# Patient Record
Sex: Female | Born: 1985 | Race: White | Hispanic: No | Marital: Married | Smoking: Never smoker
Health system: Southern US, Community
[De-identification: ages and names within clinical notes are randomized; demographics above are authoritative.]

## PROBLEM LIST (undated history)

## (undated) DIAGNOSIS — G43909 Migraine, unspecified, not intractable, without status migrainosus: Secondary | ICD-10-CM

## (undated) DIAGNOSIS — T7840XA Allergy, unspecified, initial encounter: Secondary | ICD-10-CM

## (undated) DIAGNOSIS — M1990 Unspecified osteoarthritis, unspecified site: Secondary | ICD-10-CM

## (undated) DIAGNOSIS — J45909 Unspecified asthma, uncomplicated: Secondary | ICD-10-CM

## (undated) HISTORY — PX: HAND SURGERY: SHX662

## (undated) HISTORY — PX: KNEE SURGERY: SHX244

## (undated) HISTORY — DX: Allergy, unspecified, initial encounter: T78.40XA

## (undated) HISTORY — PX: MIDDLE EAR SURGERY: SHX713

## (undated) HISTORY — DX: Unspecified osteoarthritis, unspecified site: M19.90

## (undated) HISTORY — DX: Unspecified asthma, uncomplicated: J45.909

---
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
## 2009-07-29 ENCOUNTER — Emergency Department (HOSPITAL_COMMUNITY): Admission: EM | Admit: 2009-07-29 | Discharge: 2009-07-30 | Payer: Self-pay | Admitting: Emergency Medicine

## 2010-05-02 ENCOUNTER — Other Ambulatory Visit: Payer: Self-pay | Admitting: Otolaryngology

## 2010-05-02 DIAGNOSIS — J329 Chronic sinusitis, unspecified: Secondary | ICD-10-CM

## 2010-05-10 ENCOUNTER — Ambulatory Visit
Admission: RE | Admit: 2010-05-10 | Discharge: 2010-05-10 | Disposition: A | Discharge status: Home / Self Care | Payer: 59 | Source: Ambulatory Visit | Attending: Otolaryngology | Admitting: Otolaryngology

## 2010-05-10 DIAGNOSIS — J329 Chronic sinusitis, unspecified: Secondary | ICD-10-CM

## 2010-05-26 ENCOUNTER — Ambulatory Visit (HOSPITAL_BASED_OUTPATIENT_CLINIC_OR_DEPARTMENT_OTHER)
Admission: RE | Admit: 2010-05-26 | Discharge: 2010-05-26 | Disposition: A | Discharge status: Home / Self Care | Payer: 59 | Source: Ambulatory Visit | Attending: Otolaryngology | Admitting: Otolaryngology

## 2010-05-26 DIAGNOSIS — J45909 Unspecified asthma, uncomplicated: Secondary | ICD-10-CM | POA: Insufficient documentation

## 2010-05-26 DIAGNOSIS — H7290 Unspecified perforation of tympanic membrane, unspecified ear: Secondary | ICD-10-CM | POA: Insufficient documentation

## 2010-05-26 DIAGNOSIS — Z01812 Encounter for preprocedural laboratory examination: Secondary | ICD-10-CM | POA: Insufficient documentation

## 2010-06-16 NOTE — Op Note (Signed)
NAMEAYZIA, DAY             ACCOUNT NO.:  [id number]  MEDICAL RECORD NO.:  [id number]           PATIENT TYPE:  LOCATION:                                 FACILITY:  PHYSICIAN:  Kristine Garbe. Ezzard Standing, M.D. 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  OPERATION PERFORMED:  Left medial graft tympanoplasty.  SURGEON:  Kristine Garbe. Ezzard Standing, MD  ANESTHESIA:  General endotracheal.  COMPLICATIONS:  None.  BRIEF CLINICAL NOTE:  Kristin Rice is a 25 year old female who has had previous history of ear problems and mastoid disease who had previous myringotomy tubes placed elsewhere.  Since her tubes have extruded, she has had a persistent left TM perforation.  On examination, she has approximately 30% to 40% large inferior TM perforation which is dry. 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  She is taken to the operating room at this time for left tympanoplasty.  DESCRIPTION OF PROCEDURE:  After adequate endotracheal anesthesia, the patient received 1 g Ancef IV preoperatively.  Left ear was prepped with Betadine solution and Betadine sterile towels.  Ear canal was then irrigated with saline.  Perforation was observed and was moderately large inferior-posterior TM perforation.  This was central.  The rim of the perforation was freshened up with a cup forceps and pick.  The perforation extended to the umbo after freshening up the edges of the perforation.  Ear canal was injected with Xylocaine with epinephrine  and a posterior-based tympanomeatal flap was elevated down to the annulus. Cotton pledgets soaked in adrenaline was placed for hemostasis, and prior to elevating the annulus a small postauricular incision was made just posterior and superior to the ear and a temporalis fascia graft was harvested and set aside to dry.  The graft site was closed with 3-0 chromic sutures subcutaneously and 5-0 plain gut to reapproximate skin edges.  The annulus was then elevated.  The middle ear space was entered.  There were few adhesions around the incus and stapes which were lysed.  There was no evidence of disease process, acute infection, or cholesteatoma.  Ear canal was irrigated with saline.  Few adhesions were lysed with a pick.  The previously harvested fascial graft was cut to appropriate size and was placed medial to the tympanomeatal flap covering the entire perforation.  The middle ear space was packed with Gelfoam soaked in Ciprodex.  The tympanomeatal flap and graft were brought back down over the middle ear packing.  The fascia graft covered the entire perforation well.  Ear canal was then packed with Gelfoam soaked in Ciprodex.  Cotton ball was placed in the ear canal after packing the ear canal with Gelfoam and bacitracin ointment was applied to the  small incision behind the ear and Glasscock dressing was placed over the left ear.  This completed the procedure.  Keondra was awoke from anesthesia and transferred to recovery room postop doing well.  DISPOSITION:  She was discharged home later this morning on Tylenol, Motrin, or Vicodin p.r.n. pain.  She is presently taking a Z-Pak and will complete the remaining Z-Pak.  We will have her follow up in my office in 1 week for recheck.  She was instructed to leave the Glasscock dressing on until tomorrow and can remove this and change the cotton ball in the ear and keep the ear dry.          ______________________________ Kristine Garbe.  Ezzard Standing, M.D.     CEN/MEDQ  D:  05/26/2010  T:  05/26/2010  Job:  [id number]  Electronically Signed by Dillard Cannon M.D. on 06/16/2010 11:10:00 AM

## 2012-08-25 ENCOUNTER — Ambulatory Visit: Payer: PRIVATE HEALTH INSURANCE

## 2012-08-25 ENCOUNTER — Ambulatory Visit (INDEPENDENT_AMBULATORY_CARE_PROVIDER_SITE_OTHER): Payer: PRIVATE HEALTH INSURANCE | Admitting: Family Medicine

## 2012-08-25 VITALS — BP 132/82 | HR 88 | Temp 98.3°F | Resp 16 | Ht 63.0 in | Wt 152.0 lb

## 2012-08-25 DIAGNOSIS — M25569 Pain in unspecified knee: Secondary | ICD-10-CM

## 2012-08-25 DIAGNOSIS — M25561 Pain in right knee: Secondary | ICD-10-CM

## 2012-08-25 MED ORDER — HYDROCODONE-ACETAMINOPHEN 5-325 MG PO TABS: 1.0000 | ORAL_TABLET | Freq: Four times a day (QID) | ORAL | Status: AC | PRN

## 2012-08-25 NOTE — Patient Instructions (Addendum)
Medial Collateral Knee Ligament Sprain  with Phase I Rehab The medial collateral ligament (MCL) of the knee helps hold the knee joint in proper alignment and prevents the bones from shifting out of alignment (displacing) to the inside (medially). Injury to the knee may cause a tear in the MCL ligament (sprain). Sprains may heal without treatment, but this often results in a loose joint. Sprains are classified into three categories. Grade 1 sprains cause pain, but the tendon is not lengthened. Grade 2 sprains include a lengthened ligament, due to the ligament being stretched or partially ruptured. With grade 2 sprains, there is still function, although possibly decreased. Grade 3 sprains involve a complete tear of the tendon or muscle, and function is usually impaired. SYMPTOMS   Pain and tenderness on the inner side of the knee.  A "pop," tearing or pulling sensation at the time of injury.  Bruising (contusion) at the site of injury, within 48 hours of injury.  Knee stiffness.  Limping, often walking with the knee bent. CAUSES  An MCL sprain occurs when a force is placed on the ligament that is greater than it can handle. Common mechanisms of injury include:  Direct hit (trauma) to the outer side of the knee, especially if the foot is planted on the ground.  Forceful pivoting of the body and leg, while the foot is planted on the ground. RISK INCREASES WITH:  Contact sports (football, rugby).  Sports that require pivoting or cutting (soccer).  Poor knee strength and flexibility.  Improper equipment use. PREVENTION  Warm up and stretch properly before activity.  Maintain physical fitness:  Strength, flexibility and endurance.  Cardiovascular fitness.  Wear properly fitted protective equipment (correct length of cleats for surface).  Functional braces may be effective in preventing injury. PROGNOSIS  MCL tears usually heal without the need for surgery. Sometimes however,  surgery is required. RELATED COMPLICATIONS  Frequently recurring symptoms, such as the knee giving way, knee instability or knee swelling.  Injury to other structures in the knee joint:  Meniscal cartilage, resulting in locking and swelling of the knee.  Articular cartilage, resulting in knee arthritis.  Other ligaments of the knee.  Injury to nerves, resulting in numbness of the outer leg, foot or ankle and weakness or paralysis, with inability to raise the ankle or toes.  Knee stiffness. TREATMENT Treatment first involves the use of ice and medicine, to reduce pain and inflammation. The use of strengthening and stretching exercises may help reduce pain with activity. These exercises may be performed at home, but referral to a therapist is often advised. You may be advised to walk with crutches until you are able to walk without a limp. Your caregiver may provide you with a hinged knee brace to help regain a full range of motion, while also protecting the injured knee. For severe MCL injuries or injuries that involve other ligaments of the knee, surgery is often advised. MEDICATION  Do not take pain medicine for 7 days before surgery.  Only use over-the-counter pain medicine as directed by your caregiver.  Only use prescription pain relievers as directed and only in needed amounts. HEAT AND COLD  Cold treatment (icing) should be applied for 10 to 15 minutes every 2 to 3 hours for inflammation and pain, and immediately after any activity, that aggravates the symptoms. Use ice packs or an ice massage.  Heat treatment may be used before performing stretching and strengthening activities prescribed by your caregiver, physical therapist or athletic trainer.   Use a heat pack or warm water soak. SEEK MEDICAL CARE IF:   Symptoms get worse or do not improve in 4 to 6 weeks, despite treatment.  New, unexplained symptoms develop. EXERCISES  PHASE I EXERCISES  RANGE OF MOTION (ROM) AND  STRETCHING EXERCISES Medial Collateral Knee Ligament Sprain Phase I These are some of the initial exercises that your physician, physical therapist or athletic trainer may have you perform to begin your rehabilitation. When you demonstrate gains in your flexibility and strength, your caregiver may progress you to Phase II exercises. As you perform these exercises, remember:  These initial exercises are intended to be gentle. They will help you restore motion without increasing any swelling.  Completing these exercises allows less painful movement and prepares you for the more aggressive strengthening exercises in Phase II.  An effective stretch should be held for at least 30 seconds.  A stretch should never be painful. You should only feel a gentle lengthening or release in the stretched tissue. RANGE OF MOTION Knee Flexion, Active  Lie on your back with both knees straight. (If this causes back discomfort, bend your healthy knee, placing your foot flat on the floor.)  Slowly slide your heel back toward your buttocks until you feel a gentle stretch in the front of your knee or thigh.  Hold for __________ seconds. Slowly slide your heel back to the starting position. Repeat __________ times. Complete this exercise __________ times per day. STRETCH Knee Flexion, Supine  Lie on the floor with your right / left heel and foot lightly touching the wall. (Place both feet on the wall if you do not use a door frame.)  Without using any effort, allow gravity to slide your foot down the wall slowly until you feel a gentle stretch in the front of your right / left knee.  Hold this stretch for __________ seconds. Then return the leg to the starting position, using your health leg for help, if needed. Repeat __________ times. Complete this stretch __________ times per day. RANGE OF MOTION Knee Flexion and Extension, Active-Assisted  Sit on the edge of a table or chair with your thighs firmly supported.  It may be helpful to place a folded towel under the end of your right / left thigh.  Flexion (bending): Place the ankle of your healthy leg on top of the other ankle. Use your healthy leg to gently bend your right / left knee until you feel a mild tension across the top of your knee.  Hold for __________ seconds.  Extension (straightening): Switch your ankles so your right / left leg is on top. Use your healthy leg to straighten your right / left knee until you feel a mild tension on the backside of your knee.  Hold for __________ seconds. Repeat __________ times. Complete this exercise __________ times per day. STRETCH Knee Extension Sitting  Sit with your right / left leg/heel propped on another chair, coffee table, or foot stool.  Allow your leg muscles to relax, letting gravity straighten out your knee.*  You should feel a stretch behind your right / left knee. Hold this position for __________ seconds. Repeat __________ times. Complete this stretch __________ times per day. *Your physician, physical therapist or athletic trainer may instruct you to place a __________ weight on your thigh, just above your kneecap, to deepen the stretch. STRENGTHENING EXERCISES Medial Collateral Knee Ligament Sprain Phase I These exercises may help you when beginning to rehabilitate your injury. They may resolve your   symptoms with or without further involvement from your physician, physical therapist or athletic trainer. While completing these exercises, remember:   In order to return to more demanding activities, you will likely need to progress to more challenging exercises. Your physician, physical therapist or athletic trainer will advance your exercises when your tissues show adequate healing and your muscles demonstrate increased strength.  Muscles can gain both the endurance and the strength needed for everyday activities through controlled exercises.  Complete these exercises as instructed by  your physician, physical therapist or athletic trainer. Increase the resistance and repetitions only as guided by your caregiver. STRENGTH Quadriceps, Isometrics  Lie on your back with your right / left leg extended and your opposite knee bent.  Gradually tense the muscles in the front of your right / left thigh. You should see either your kneecap slide up toward your hip or an increased dimpling just above the knee. This motion will push the back of the knee down toward the floor, mat or bed on which you are lying.  Hold the muscle as tight as you can without increasing your pain for __________ seconds.  Relax the muscles slowly and completely in between each repetition. Repeat __________ times. Complete this exercise __________ times per day. STRENGTH Quadriceps, Short Arcs  Lie on your back. Place a __________ inch towel roll under your knee so that the knee slightly bends.  Raise only your lower leg by tightening the muscles in the front of your thigh. Do not allow your thigh to rise.  Hold this position for __________ seconds. Repeat __________ times. Complete this exercise __________ times per day. OPTIONAL ANKLE WEIGHTS: Begin with ____________________, but DO NOT exceed ____________________. Increase in 1 pound/0.5 kilogram increments.  STRENGTH- Quadriceps, Straight Leg Raises Quality counts! Watch for signs that the quadriceps muscle is working, to be sure you are strengthening the correct muscles and not "cheating" by substituting with healthier muscles.  Lay on your back with your right / left leg extended and your opposite knee bent.  Tense the muscles in the front of your right / left thigh. You should see either your knee cap slide up or increased dimpling just above the knee. Your thigh may even shake a bit.  Tighten these muscles even more and raise your leg 4 to 6 inches off the floor. Hold for __________ seconds.  Keeping these muscles tense, lower your leg.  Relax  the muscles slowly and completely in between each repetition. Repeat __________ times. Complete this exercise __________ times per day. STRENGTH Hamstring, Isometrics  Lie on your back on a firm surface.  Bend your right / left knee approximately __________ degrees.  Dig your heel into the surface as if you are trying to pull it toward your buttocks. Tighten the muscles in the back of your thighs to "dig" as hard as you can, without increasing any pain.  Hold this position for __________ seconds.  Release the tension gradually and allow your muscle to completely relax for __________ seconds in between each exercise. Repeat __________ times. Complete this exercise __________ times per day. STRENGTH Hamstring, Curls  Lay on your stomach with your legs extended. (If you lay on a bed, your feet may hang over the edge.)  Tighten the muscles in the back of your thigh to bend your right / left knee up to 90 degrees. Keep your hips flat on the bed.  Hold this position for __________ seconds.  Slowly lower your leg back to the starting   position. Repeat __________ times. Complete this exercise __________ times per day. OPTIONAL ANKLE WEIGHTS: Begin with ____________________, but DO NOT exceed ____________________. Increase in 1 pound/0.5 kilogram increments.  Document Released: 03/12/2005 Document Revised: 06/04/2011 Document Reviewed: 06/24/2008 [employer] Patient Information 2014 [employer].  

## 2012-08-25 NOTE — Progress Notes (Signed)
Subjective:    Patient ID: Kristin Rice, female    DOB: Aug 16, 1985, 27 y.o.   MRN: [id number] Chief Complaint  Patient presents with  . Knee Pain    Right knee since Friday    HPI Right knee popped on Friday - was packing her stuff and bent over to grab something - and locked up - and now hurts to do anything.  It is a little swollen and is ttp - esp on the medial aspect. Hurts to walk - hurts to even press the pedal when driving.  Had surgery on right knee when she was in college - around 2009 - the plicca caught on leg bone and everytime she bent her knee it got stuck and was pinching the plicca.  Has been taking naproxen 2 tabs sev times a day w/o sig relief since Fri.   Went to [facility] and followed for 4 mos in 2009 during her right knee pain. Cortisone inj made it worse. Then went to ortho in [facility] (where her parents live) who did arthroscopy and cleaned it out as also had runners knee. This feels different from the prior pain. Small bruise over lateral joint line.  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  Pops the hurts for a little but never this severe.  Past Medical History  Diagnosis Date  . Allergy   . Arthritis   . Asthma    No current outpatient prescriptions on file prior to visit.   No current facility-administered medications on file prior to visit.   Allergies  Allergen Reactions  . Augmentin (Amoxicillin-Pot Clavulanate)   . Avelox (Moxifloxacin Hcl In Nacl)   . Relpax (Eletriptan)    Review of Systems  Constitutional: Positive for activity change. Negative for fever, chills and unexpected weight change.  Musculoskeletal: Positive for joint swelling, arthralgias and gait problem. Negative for myalgias and back pain.  Skin: Negative for color change and rash.  Neurological: Negative for weakness and numbness.      BP 132/82  Pulse 88  Temp(Src) 98.3 F (36.8 C)  Resp 16  Ht 5\' 3"  (1.6 m)  Wt 152 lb (68.947 kg)  BMI 26.93 kg/m2  SpO2 100%  LMP 08/18/2012 Objective:   Physical Exam  Constitutional: She is oriented to person, place, and time. She appears well-developed and well-nourished. No distress.  HENT:  Head: Normocephalic and atraumatic.  Right Ear: External ear normal.  Eyes: Conjunctivae are normal. No scleral icterus.  Pulmonary/Chest: Effort normal.  Musculoskeletal:       Right knee: She exhibits ecchymosis, bony tenderness and MCL laxity. She exhibits normal range of motion, no swelling, no effusion, no deformity, no laceration, no erythema, normal alignment, no LCL laxity, normal patellar mobility and normal meniscus. Tenderness found. Medial joint line and MCL tenderness noted.  +McMurrays and increased pain with varus stress. Neg ant and posterior drawer. Mild amount of creptius  Neurological: She is alert and oriented to person, place, and time.  Skin: Skin is warm and dry. She is not diaphoretic. No erythema.  Psychiatric: She has a normal mood and affect. Her behavior is normal.      [facility] reading (PRIMARY) by  Dr. Clelia Croft. No acute abnormality  Assessment & Plan:  Knee pain, acute, right - Plan: DG Knee Complete 4 Views Right Poss MCL injury - recommend conservative therapy intially with hinged knee brace and RICE. If pain and swelling continues over the next several days, rec proceeding with MRI and poss ortho referral prn.   We  do not have appropriate sized hinged knee brace in office so written rx given.  Meds ordered this encounter  Medications  . APAP-Isometheptene-Dichloral (MIDRIN PO)    Sig: Take by mouth.  . topiramate (TOPAMAX) 200 MG tablet    Sig: Take 200 mg by mouth 2 (two) times daily.  Marland Kitchen albuterol (PROVENTIL HFA;VENTOLIN HFA) 108 (90 BASE) MCG/ACT inhaler    Sig: Inhale 2 puffs into the lungs every 6 (six) hours as needed for wheezing.  . fluticasone (FLONASE) 50 MCG/ACT nasal spray    Sig: Place 2 sprays into the nose daily.  . fexofenadine (ALLEGRA) 180 MG tablet    Sig: Take 180 mg by mouth daily.  Marland Kitchen  HYDROcodone-acetaminophen (NORCO) 5-325 MG per tablet    Sig: Take 1 tablet by mouth every 6 (six) hours as needed for pain.    Dispense:  30 tablet    Refill:  0   .

## 2012-08-28 ENCOUNTER — Encounter: Payer: Self-pay | Admitting: Family Medicine

## 2012-08-29 ENCOUNTER — Other Ambulatory Visit: Payer: Self-pay | Admitting: Family Medicine

## 2012-08-29 DIAGNOSIS — M25561 Pain in right knee: Secondary | ICD-10-CM

## 2013-01-29 ENCOUNTER — Other Ambulatory Visit: Payer: Self-pay

## 2015-01-25 IMAGING — CR DG KNEE COMPLETE 4+V*R*
4 series · 4 of 4 positions shown · non-contrast
Comparison: No priors.

CLINICAL DATA: Injury of the right knee complaining of right knee
pain.

RIGHT KNEE - COMPLETE 4+ VIEW

[AP]
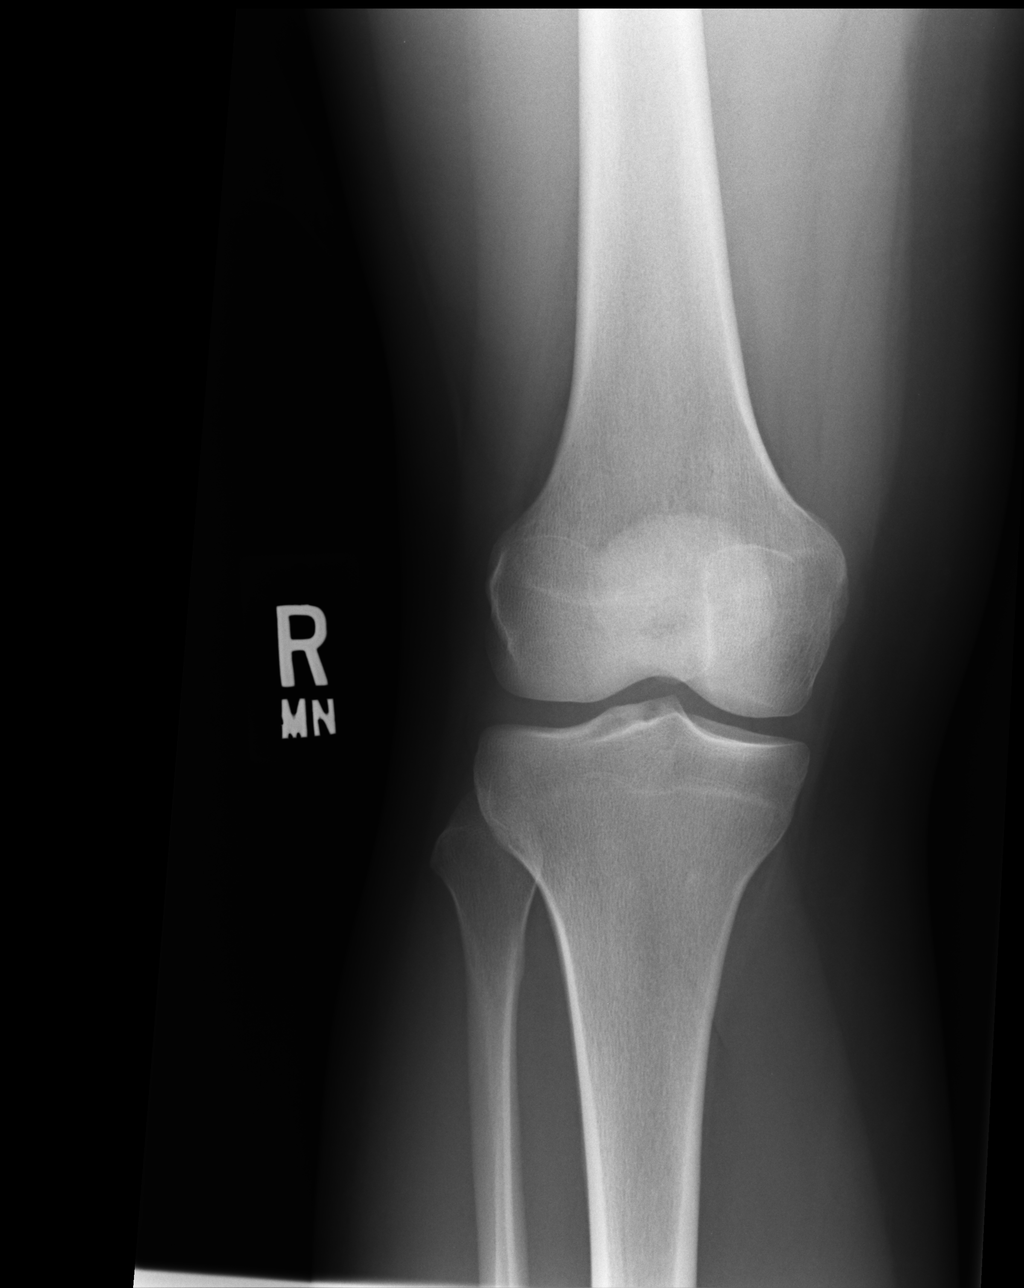

[lateral]
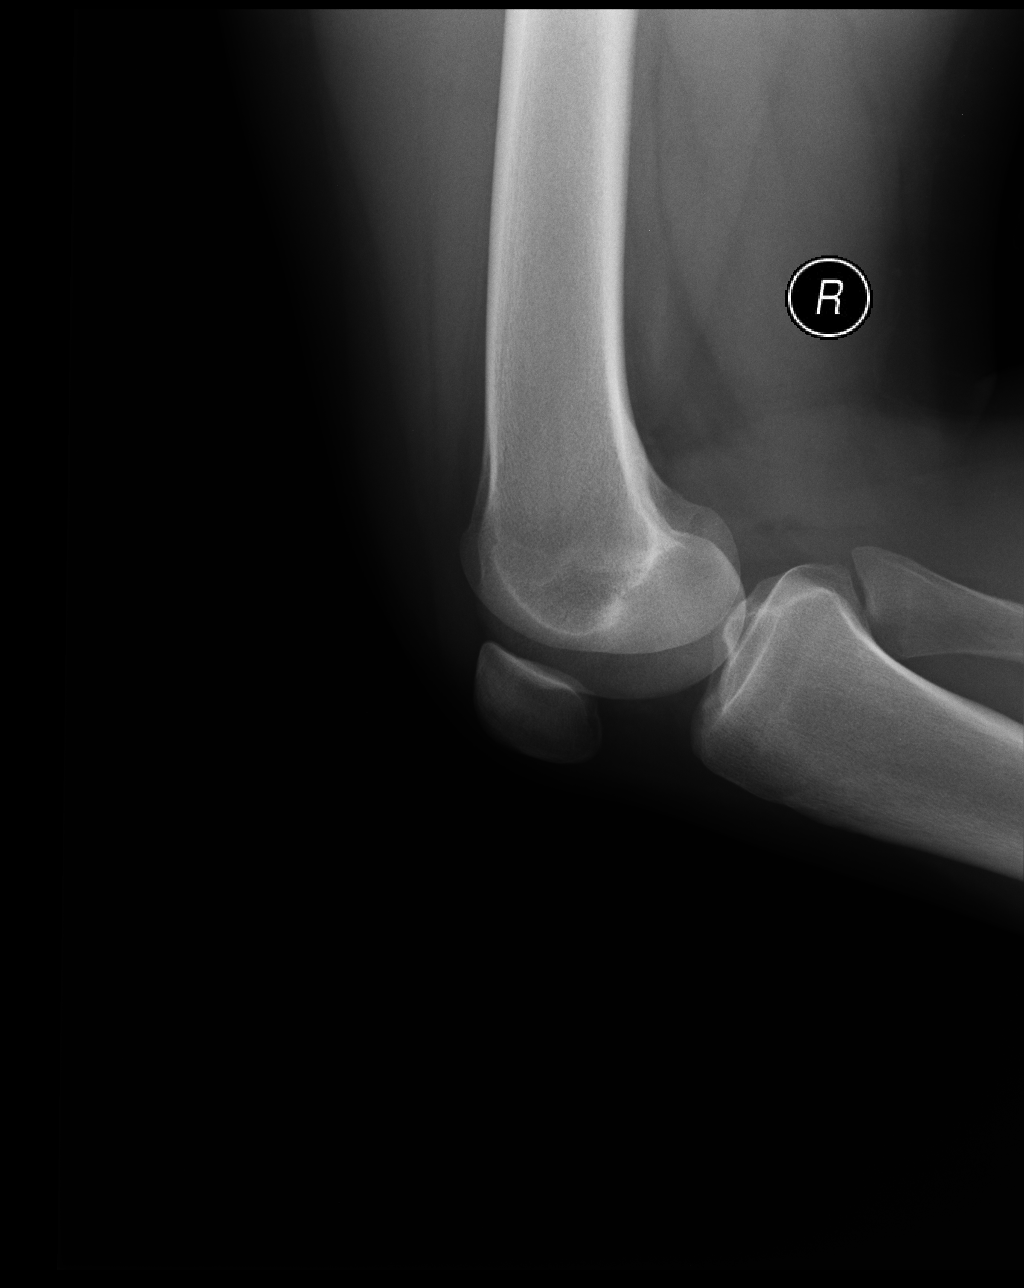

[pa axial]
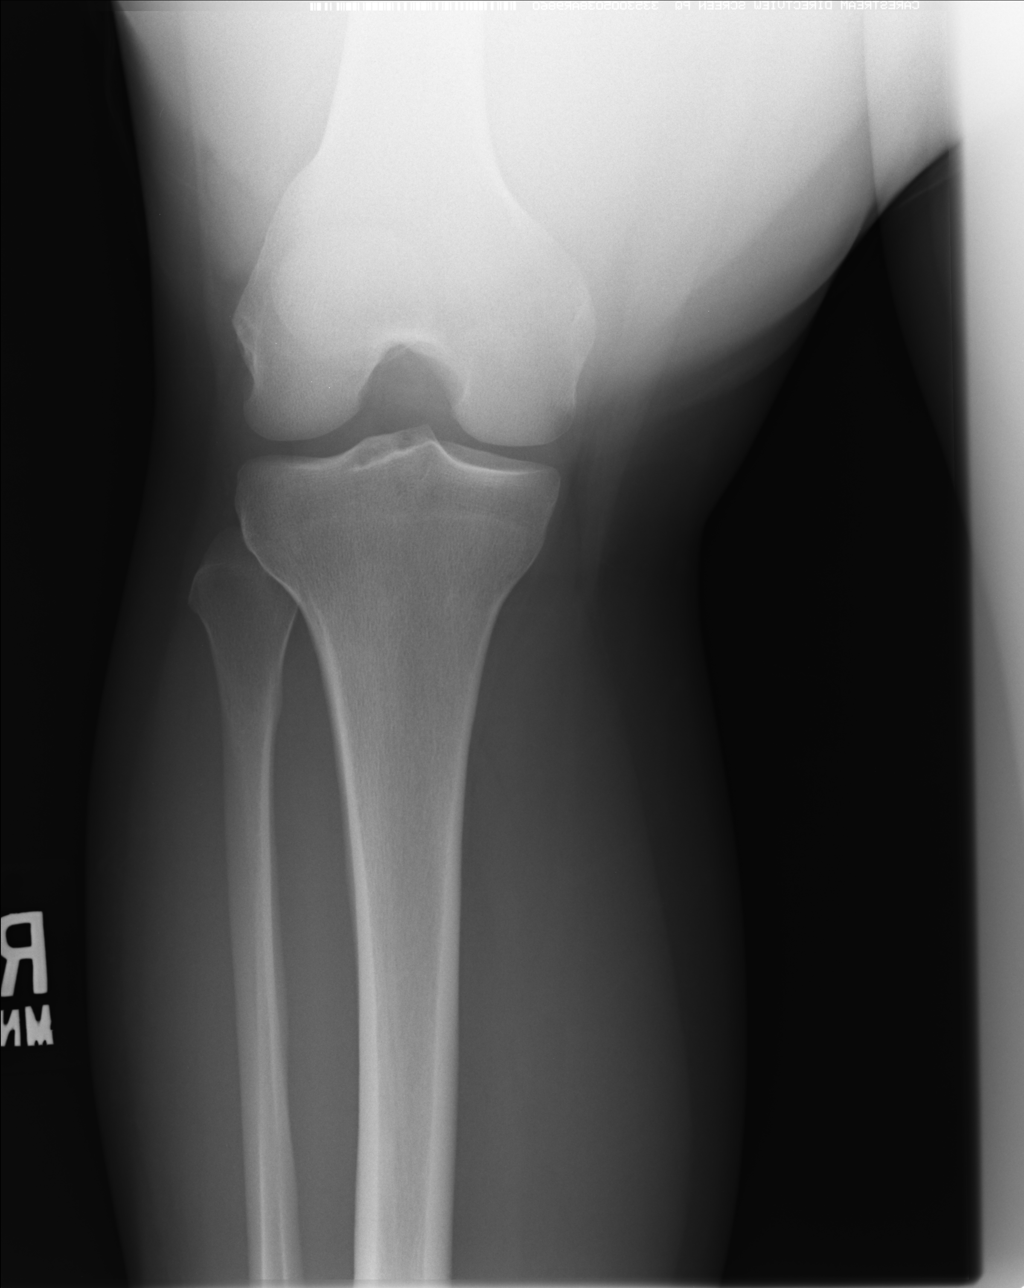

[sunrise]
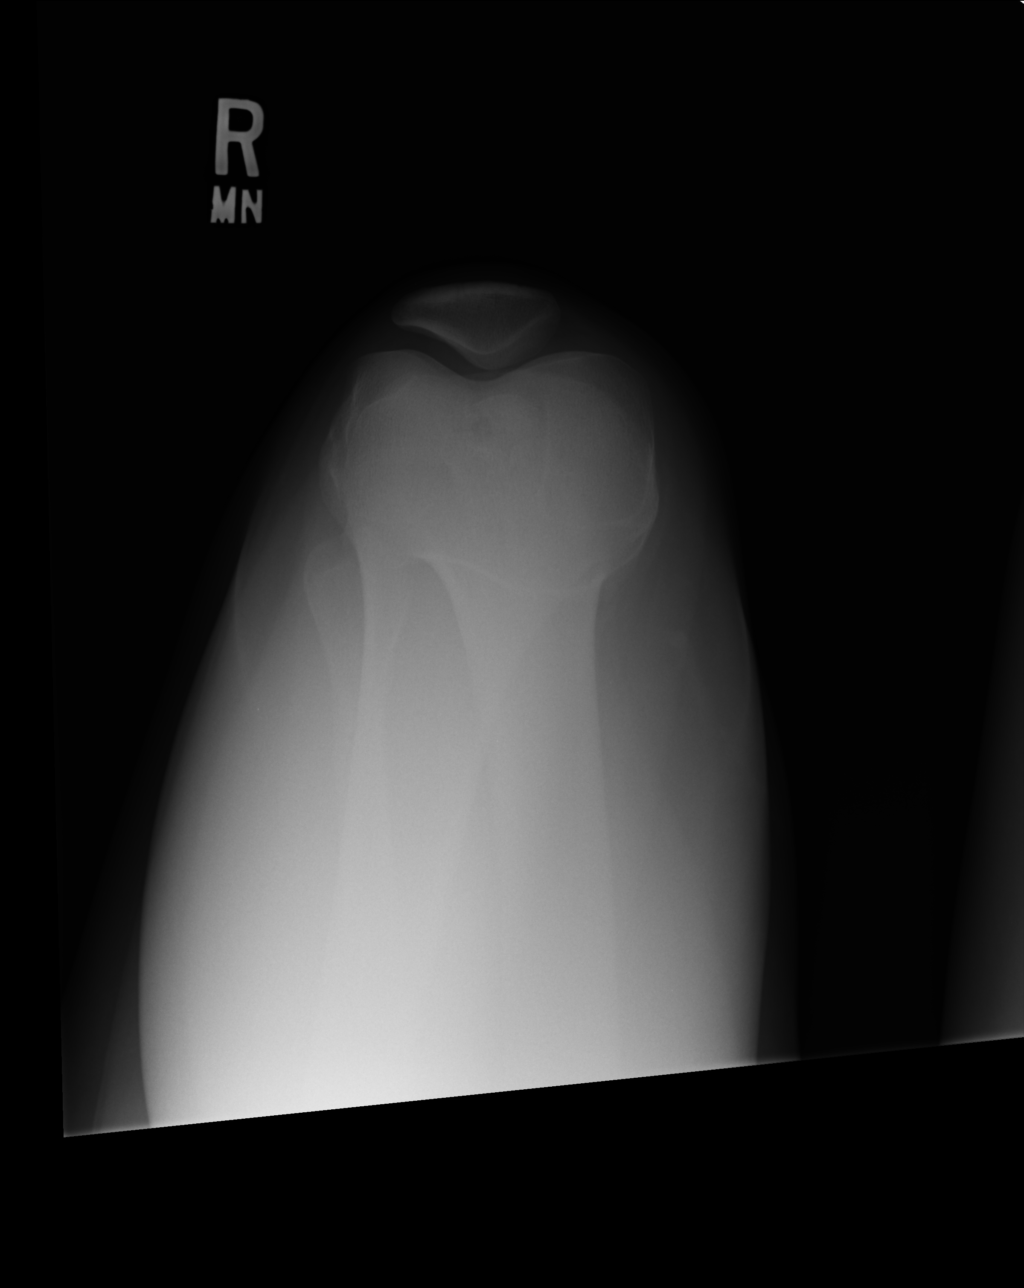

[4 of 4 positions shown; findings below may reference images not displayed]

FINDINGS: Four views of the right knee demonstrate no acute
displaced fracture, subluxation, dislocation, joint or soft tissue
abnormality.
IMPRESSION: 1.  No acute radiographic abnormality of the right knee.

Clinically significant discrepancy from primary report, if
provided: None

## 2019-06-26 ENCOUNTER — Ambulatory Visit: Payer: BC Managed Care – PPO | Attending: Internal Medicine

## 2019-06-26 DIAGNOSIS — Z20822 Contact with and (suspected) exposure to covid-19: Secondary | ICD-10-CM

## 2019-06-27 LAB — SARS-COV-2, NAA 2 DAY TAT

## 2019-06-27 LAB — NOVEL CORONAVIRUS, NAA: SARS-CoV-2, NAA: NOT DETECTED

## 2020-03-02 ENCOUNTER — Ambulatory Visit
Admission: RE | Admit: 2020-03-02 | Discharge: 2020-03-02 | Disposition: A | Discharge status: Home / Self Care | Payer: Managed Care, Other (non HMO) | Source: Ambulatory Visit | Attending: Allergy and Immunology | Admitting: Allergy and Immunology

## 2020-03-02 ENCOUNTER — Other Ambulatory Visit: Payer: Self-pay | Admitting: Allergy and Immunology

## 2020-03-02 ENCOUNTER — Other Ambulatory Visit: Payer: Self-pay

## 2020-03-02 DIAGNOSIS — J209 Acute bronchitis, unspecified: Secondary | ICD-10-CM

## 2020-07-24 DIAGNOSIS — U071 COVID-19: Secondary | ICD-10-CM

## 2020-07-24 HISTORY — DX: COVID-19: U07.1

## 2020-09-15 ENCOUNTER — Other Ambulatory Visit: Payer: Self-pay

## 2020-09-15 ENCOUNTER — Ambulatory Visit
Admission: EM | Admit: 2020-09-15 | Discharge: 2020-09-15 | Disposition: A | Discharge status: Home / Self Care | Payer: Managed Care, Other (non HMO) | Attending: Emergency Medicine | Admitting: Emergency Medicine

## 2020-09-15 ENCOUNTER — Ambulatory Visit (INDEPENDENT_AMBULATORY_CARE_PROVIDER_SITE_OTHER): Payer: Managed Care, Other (non HMO)

## 2020-09-15 ENCOUNTER — Encounter: Payer: Self-pay | Admitting: Emergency Medicine

## 2020-09-15 DIAGNOSIS — R051 Acute cough: Secondary | ICD-10-CM

## 2020-09-15 DIAGNOSIS — U071 COVID-19: Secondary | ICD-10-CM

## 2020-09-15 DIAGNOSIS — J1282 Pneumonia due to coronavirus disease 2019: Secondary | ICD-10-CM

## 2020-09-15 HISTORY — DX: Migraine, unspecified, not intractable, without status migrainosus: G43.909

## 2020-09-15 MED ORDER — AZITHROMYCIN 250 MG PO TABS: 250.0000 mg | ORAL_TABLET | Freq: Every day | ORAL | 0 refills | Status: DC

## 2020-09-15 NOTE — ED Provider Notes (Signed)
[facility]    CSN: [id number] Arrival date & time: 09/15/20  1023      History   Chief Complaint Chief Complaint  Patient presents with   Cough    HPI Kristin Rice is a 35 y.o. female.  Patient presents with cough and mild shortness of breath since having COVID 4 weeks ago.  She tested positive with an at home test.  She denies fever, chills, or other symptoms.  Treatment attempted at home with Sudafed, Mucinex, Aleve.  Her medical history includes asthma, seasonal allergies, migraines.  The history is provided by the patient and medical records.   Past Medical History:  Diagnosis Date   Allergy    Arthritis    Asthma    COVID-19 07/2020   Migraines     There are no problems to display for this patient.   Past Surgical History:  Procedure Laterality Date   HAND SURGERY     KNEE SURGERY      OB History   No obstetric history on file.      Home Medications    Prior to Admission medications   Medication Sig Start Date End Date Taking? Authorizing Provider  azithromycin (ZITHROMAX) 250 MG tablet Take 1 tablet (250 mg total) by mouth daily. Take first 2 tablets together, then 1 every day until finished. 09/15/20  Yes Mickie Bail, NP  albuterol (PROVENTIL HFA;VENTOLIN HFA) 108 (90 BASE) MCG/ACT inhaler Inhale 2 puffs into the lungs every 6 (six) hours as needed for wheezing.    [provider]  APAP-Isometheptene-Dichloral (MIDRIN PO) Take by mouth.    [provider]  fexofenadine (ALLEGRA) 180 MG tablet Take 180 mg by mouth daily.    [provider]  fluticasone (FLONASE) 50 MCG/ACT nasal spray Place 2 sprays into the nose daily.    [provider]  HYDROcodone-acetaminophen (NORCO) 5-325 MG per tablet Take 1 tablet by mouth every 6 (six) hours as needed for pain. 08/25/12   Sherren Mocha, MD  topiramate (TOPAMAX) 200 MG tablet Take 200 mg by mouth 2 (two) times daily.    [provider]    Family  History Family History  Problem Relation Age of Onset   Diabetes Mother    Heart disease Father    Allergies Father    Allergies Brother     Social History Social History   Tobacco Use   Smoking status: Never   Smokeless tobacco: Never  Substance Use Topics   Alcohol use: Yes    Alcohol/week: 1.0 standard drink    Types: 1 drink(s) per week   Drug use: No     Allergies   Augmentin [amoxicillin-pot clavulanate], Avelox [moxifloxacin hcl in nacl], and Relpax [eletriptan]   Review of Systems Review of Systems  Constitutional:  Negative for chills and fever.  HENT:  Negative for ear pain and sore throat.   Respiratory:  Positive for cough and shortness of breath.   Cardiovascular:  Negative for chest pain and palpitations.  Gastrointestinal:  Negative for abdominal pain and vomiting.  Skin:  Negative for color change and rash.  All other systems reviewed and are negative.   Physical Exam Triage Vital Signs ED Triage Vitals  Enc Vitals Group     BP 09/15/20 1138 128/83     Pulse Rate 09/15/20 1138 77     Resp 09/15/20 1138 18     Temp 09/15/20 1138 99 F (37.2 C)     Temp Source 09/15/20  1138 Oral     SpO2 09/15/20 1138 98 %     Weight --      Height --      Head Circumference --      Peak Flow --      Pain Score 09/15/20 1140 3     Pain Loc --      Pain Edu? --      Excl. in GC? --    No data found.  Updated Vital Signs BP 128/83 (BP Location: Left Arm)   Pulse 77   Temp 99 F (37.2 C) (Oral)   Resp 18   LMP 08/25/2020   SpO2 98%   Visual Acuity Right Eye Distance:   Left Eye Distance:   Bilateral Distance:    Right Eye Near:   Left Eye Near:    Bilateral Near:     Physical Exam Vitals and nursing note reviewed.  Constitutional:      General: She is not in acute distress.    Appearance: She is well-developed. She is not ill-appearing.  HENT:     Head: Normocephalic and atraumatic.     Right Ear: Tympanic membrane normal.     Left Ear:  Tympanic membrane normal.     Nose: Nose normal.     Mouth/Throat:     Mouth: Mucous membranes are moist.     Pharynx: Oropharynx is clear.  Eyes:     Conjunctiva/sclera: Conjunctivae normal.  Cardiovascular:     Rate and Rhythm: Normal rate and regular rhythm.     Heart sounds: Normal heart sounds.  Pulmonary:     Effort: Pulmonary effort is normal. No respiratory distress.     Breath sounds: Normal breath sounds. No wheezing, rhonchi or rales.  Abdominal:     Palpations: Abdomen is soft.     Tenderness: There is no abdominal tenderness.  Musculoskeletal:     Cervical back: Neck supple.  Skin:    General: Skin is warm and dry.  Neurological:     General: No focal deficit present.     Mental Status: She is alert and oriented to person, place, and time.     Gait: Gait normal.  Psychiatric:        Mood and Affect: Mood normal.        Behavior: Behavior normal.     UC Treatments / Results  Labs (all labs ordered are listed, but only abnormal results are displayed) Labs Reviewed - No data to display  EKG   Radiology DG Chest 2 View  Result Date: 09/15/2020 CLINICAL DATA:  cough, SOB.  COVID 4 weeks ago. EXAM: CHEST - 2 VIEW COMPARISON:  Chest radiograph 03/02/2020 FINDINGS: The heart size and mediastinal contours are within normal limits.There are new faint bibasilar opacities, right greater than left.No pleural effusion or pneumothorax.No acute osseous abnormality. IMPRESSION: New faint bibasilar opacities, right greater than left, suggestive of infectious/inflammatory process. Electronically Signed   By: Caprice Renshaw   On: 09/15/2020 12:39    Procedures Procedures (including critical care time)  Medications Ordered in UC Medications - No data to display  Initial Impression / Assessment and Plan / UC Course  I have reviewed the triage vital signs and the nursing notes.  Pertinent labs & imaging results that were available during my care of the patient were reviewed by  me and considered in my medical decision making (see chart for details).  Pneumonia due to COVID-19.  Chest x-ray shows "new faint bibasilar opacities, right  greater than left, suggestive of infectious/inflammatory process."  Patient is well-appearing, moving good air, O2 sat 98%.  Treating with Zithromax.  Instructed patient to follow-up with her PCP in 1 week for recheck but she states she does not have a PCP.  Instructed her to go to the ED if she has acute worsening symptoms, such as shortness of breath.  She agrees to plan of care.       Final Clinical Impressions(s) / UC Diagnoses   Final diagnoses:  Pneumonia due to COVID-19 virus     Discharge Instructions      Take the Zithromax as directed.  Follow-up with your primary care provider in 1 week for recheck.  Go to the emergency department if you have acute shortness of breath or other concerning symptoms.         ED Prescriptions     Medication Sig Dispense Auth. Provider   azithromycin (ZITHROMAX) 250 MG tablet Take 1 tablet (250 mg total) by mouth daily. Take first 2 tablets together, then 1 every day until finished. 6 tablet Mickie Bail, NP      PDMP not reviewed this encounter.   Mickie Bail, NP 09/15/20 1257

## 2020-09-15 NOTE — Discharge Instructions (Addendum)
Take the Zithromax as directed.  Follow-up with your primary care provider in 1 week for recheck.  Go to the emergency department if you have acute shortness of breath or other concerning symptoms.

## 2020-09-15 NOTE — ED Triage Notes (Signed)
Pt presents today with c/o of cough. She reports testing positive for Covid 4 weeks ago and has not been able to get rid of cough. Denies fever. She took Aleve this morning.

## 2021-03-20 ENCOUNTER — Encounter: Payer: Self-pay | Admitting: Emergency Medicine

## 2021-03-20 ENCOUNTER — Other Ambulatory Visit: Payer: Self-pay

## 2021-03-20 ENCOUNTER — Ambulatory Visit
Admission: EM | Admit: 2021-03-20 | Discharge: 2021-03-20 | Disposition: A | Discharge status: Home / Self Care | Payer: Managed Care, Other (non HMO) | Attending: Emergency Medicine | Admitting: Emergency Medicine

## 2021-03-20 DIAGNOSIS — J0100 Acute maxillary sinusitis, unspecified: Secondary | ICD-10-CM | POA: Diagnosis not present

## 2021-03-20 MED ORDER — AZITHROMYCIN 250 MG PO TABS: 250.0000 mg | ORAL_TABLET | Freq: Every day | ORAL | 0 refills | Status: DC

## 2021-03-20 NOTE — Discharge Instructions (Addendum)
Take the antibiotic as directed.  Follow up with your primary care provider if your symptoms are not improving.     

## 2021-03-20 NOTE — ED Provider Notes (Signed)
[facility]    CSN: [id number] Arrival date & time: 03/20/21  1540      History   Chief Complaint Chief Complaint  Patient presents with   Otalgia    HPI Kristin Rice is a 35 y.o. female.  Patient presents with 2-week history of sinus congestion, sinus pressure, postnasal drip, ear pain.  She also reports occasional mild nonproductive cough.  OTC treatment attempted with Sudafed.  She denies wheezing, shortness of breath, fever, sore throat, pain, diarrhea, or other symptoms.  Her medical history includes asthma, allergies, migraine headaches.  The history is provided by the patient and medical records.   Past Medical History:  Diagnosis Date   Allergy    Arthritis    Asthma    COVID-19 07/2020   Migraines     There are no problems to display for this patient.   Past Surgical History:  Procedure Laterality Date   HAND SURGERY     KNEE SURGERY      OB History   No obstetric history on file.      Home Medications    Prior to Admission medications   Medication Sig Start Date End Date Taking? Authorizing Provider  albuterol (PROVENTIL HFA;VENTOLIN HFA) 108 (90 BASE) MCG/ACT inhaler Inhale 2 puffs into the lungs every 6 (six) hours as needed for wheezing.    [provider]  APAP-Isometheptene-Dichloral (MIDRIN PO) Take by mouth.    [provider]  azithromycin (ZITHROMAX) 250 MG tablet Take 1 tablet (250 mg total) by mouth daily. Take first 2 tablets together, then 1 every day until finished. 03/20/21  Yes Mickie Bail, NP  fexofenadine (ALLEGRA) 180 MG tablet Take 180 mg by mouth daily.    [provider]  fluticasone (FLONASE) 50 MCG/ACT nasal spray Place 2 sprays into the nose daily.    [provider]  HYDROcodone-acetaminophen (NORCO) 5-325 MG per tablet Take 1 tablet by mouth every 6 (six) hours as needed for pain. 08/25/12   Sherren Mocha, MD  topiramate (TOPAMAX) 200 MG tablet Take 200 mg by mouth 2 (two)  times daily.    [provider]    Family History Family History  Problem Relation Age of Onset   Diabetes Mother    Heart disease Father    Allergies Father    Allergies Brother     Social History Social History   Tobacco Use   Smoking status: Never   Smokeless tobacco: Never  Vaping Use   Vaping Use: Never used  Substance Use Topics   Alcohol use: Yes    Alcohol/week: 1.0 standard drink    Types: 1 drink(s) per week   Drug use: No     Allergies   Augmentin [amoxicillin-pot clavulanate], Avelox [moxifloxacin hcl in nacl], and Relpax [eletriptan]   Review of Systems Review of Systems  Constitutional:  Negative for chills and fever.  HENT:  Positive for congestion, ear pain, postnasal drip and sinus pressure. Negative for sore throat.   Respiratory:  Positive for cough. Negative for shortness of breath.   Cardiovascular:  Negative for chest pain and palpitations.  Gastrointestinal:  Negative for diarrhea and vomiting.  Skin:  Negative for color change and rash.  All other systems reviewed and are negative.   Physical Exam Triage Vital Signs ED Triage Vitals  Enc Vitals Group     BP 03/20/21 1721 126/87     Pulse Rate 03/20/21 1721 81     Resp --  Temp 03/20/21 1721 98.4 F (36.9 C)     Temp Source 03/20/21 1721 Oral     SpO2 03/20/21 1721 98 %     Weight --      Height --      Head Circumference --      Peak Flow --      Pain Score 03/20/21 1723 7     Pain Loc --      Pain Edu? --      Excl. in GC? --    No data found.  Updated Vital Signs BP 126/87 (BP Location: Left Arm)    Pulse 81    Temp 98.4 F (36.9 C) (Oral)    LMP 03/17/2021    SpO2 98%   Visual Acuity Right Eye Distance:   Left Eye Distance:   Bilateral Distance:    Right Eye Near:   Left Eye Near:    Bilateral Near:     Physical Exam Vitals and nursing note reviewed.  Constitutional:      General: She is not in acute distress.    Appearance: She is  well-developed. She is not ill-appearing.  HENT:     Right Ear: Tympanic membrane normal.     Left Ear: Tympanic membrane normal.     Nose: Congestion present.     Mouth/Throat:     Mouth: Mucous membranes are moist.     Pharynx: Oropharynx is clear.  Cardiovascular:     Rate and Rhythm: Normal rate and regular rhythm.     Heart sounds: Normal heart sounds.  Pulmonary:     Effort: Pulmonary effort is normal. No respiratory distress.     Breath sounds: Normal breath sounds.  Musculoskeletal:     Cervical back: Neck supple.  Skin:    General: Skin is warm and dry.  Neurological:     Mental Status: She is alert.  Psychiatric:        Mood and Affect: Mood normal.        Behavior: Behavior normal.     UC Treatments / Results  Labs (all labs ordered are listed, but only abnormal results are displayed) Labs Reviewed - No data to display  EKG   Radiology No results found.  Procedures Procedures (including critical care time)  Medications Ordered in UC Medications - No data to display  Initial Impression / Assessment and Plan / UC Course  I have reviewed the triage vital signs and the nursing notes.  Pertinent labs & imaging results that were available during my care of the patient were reviewed by me and considered in my medical decision making (see chart for details).    Acute sinusitis.  Patient has been symptomatic for 2 weeks and is not improving with OTC treatment.  Treating today with Zithromax.  Discussed other symptomatic treatment including Tylenol or ibuprofen.  Instructed her to follow-up with her PCP if her symptoms are not improving.  She agrees to plan of care.  Final Clinical Impressions(s) / UC Diagnoses   Final diagnoses:  Acute non-recurrent maxillary sinusitis     Discharge Instructions      Take the antibiotic as directed.  Follow up with your primary care provider if your symptoms are not improving.        ED Prescriptions      Medication Sig Dispense Auth. Provider   azithromycin (ZITHROMAX) 250 MG tablet Take 1 tablet (250 mg total) by mouth daily. Take first 2 tablets together, then 1 every day until  finished. 6 tablet Mickie Bail, NP      PDMP not reviewed this encounter.   Mickie Bail, NP 03/20/21 [phone]

## 2021-03-20 NOTE — ED Triage Notes (Signed)
Pt presents with bilateral ear pain and sinus pressure for a couple of weeks.

## 2021-07-12 ENCOUNTER — Ambulatory Visit
Admission: EM | Admit: 2021-07-12 | Discharge: 2021-07-12 | Disposition: A | Discharge status: Home / Self Care | Payer: Managed Care, Other (non HMO) | Attending: Urgent Care | Admitting: Urgent Care

## 2021-07-12 ENCOUNTER — Encounter: Payer: Self-pay | Admitting: Emergency Medicine

## 2021-07-12 DIAGNOSIS — J019 Acute sinusitis, unspecified: Secondary | ICD-10-CM

## 2021-07-12 DIAGNOSIS — J309 Allergic rhinitis, unspecified: Secondary | ICD-10-CM | POA: Diagnosis not present

## 2021-07-12 DIAGNOSIS — J4530 Mild persistent asthma, uncomplicated: Secondary | ICD-10-CM | POA: Diagnosis not present

## 2021-07-12 MED ORDER — PREDNISONE 20 MG PO TABS: ORAL_TABLET | ORAL | 0 refills | Status: DC

## 2021-07-12 MED ORDER — ALBUTEROL SULFATE HFA 108 (90 BASE) MCG/ACT IN AERS: 2.0000 | INHALATION_SPRAY | Freq: Four times a day (QID) | RESPIRATORY_TRACT | 0 refills | Status: AC | PRN

## 2021-07-12 MED ORDER — LEVOCETIRIZINE DIHYDROCHLORIDE 5 MG PO TABS: 5.0000 mg | ORAL_TABLET | Freq: Every evening | ORAL | 0 refills | Status: AC

## 2021-07-12 MED ORDER — AMOXICILLIN 875 MG PO TABS: 875.0000 mg | ORAL_TABLET | Freq: Two times a day (BID) | ORAL | 0 refills | Status: DC

## 2021-07-12 MED ORDER — DOXYCYCLINE HYCLATE 100 MG PO CAPS: 100.0000 mg | ORAL_CAPSULE | Freq: Two times a day (BID) | ORAL | 0 refills | Status: DC

## 2021-07-12 NOTE — ED Provider Notes (Signed)
?[facility] ? ? ?MRN: [id number] DOB: [birth year] ? ?Subjective:  ? ?Kristin Rice is a 36 y.o. female presenting for 10-day history of acute onset persistent sinus pressure, sinus pain, coughing, intermittent shortness of breath.  Patient has a history of allergic rhinitis and asthma.  She has been using Xyzal, Flonase.  Has also used over-the-counter medications.  Needs a refill on her inhaler.  Patient was seen at the end of December and has had to undergo treatment for sinus infection, ear infection.  Was using pseudoephedrine extensively.  Eventually her sinuses were treated up until this particular episode. ? ?No current facility-administered medications for this encounter. ? ?Current Outpatient Medications:  ?  albuterol (PROVENTIL HFA;VENTOLIN HFA) 108 (90 BASE) MCG/ACT inhaler, Inhale 2 puffs into the lungs every 6 (six) hours as needed for wheezing., Disp: , Rfl:  ?  APAP-Isometheptene-Dichloral (MIDRIN PO), Take by mouth., Disp: , Rfl:  ?  azithromycin (ZITHROMAX) 250 MG tablet, Take 1 tablet (250 mg total) by mouth daily. Take first 2 tablets together, then 1 every day until finished., Disp: 6 tablet, Rfl: 0 ?  fexofenadine (ALLEGRA) 180 MG tablet, Take 180 mg by mouth daily., Disp: , Rfl:  ?  fluticasone (FLONASE) 50 MCG/ACT nasal spray, Place 2 sprays into the nose daily., Disp: , Rfl:  ?  HYDROcodone-acetaminophen (NORCO) 5-325 MG per tablet, Take 1 tablet by mouth every 6 (six) hours as needed for pain., Disp: 30 tablet, Rfl: 0 ?  topiramate (TOPAMAX) 200 MG tablet, Take 200 mg by mouth 2 (two) times daily., Disp: , Rfl:   ? ?Allergies  ?Allergen Reactions  ? Augmentin [Amoxicillin-Pot Clavulanate]   ? Avelox [Moxifloxacin Hcl In Nacl]   ? Relpax [Eletriptan]   ? ? ?Past Medical History:  ?Diagnosis Date  ? Allergy   ? Arthritis   ? Asthma   ? COVID-19 07/2020  ? Migraines   ?  ? ?Past Surgical History:  ?Procedure Laterality Date  ? HAND SURGERY    ? KNEE SURGERY     ? ? ?Family History  ?Problem Relation Age of Onset  ? Diabetes Mother   ? Heart disease Father   ? Allergies Father   ? Allergies Brother   ? ? ?Social History  ? ?Tobacco Use  ? Smoking status: Never  ? Smokeless tobacco: Never  ?Vaping Use  ? Vaping Use: Never used  ?Substance Use Topics  ? Alcohol use: Yes  ?  Alcohol/week: 1.0 standard drink  ?  Types: 1 drink(s) per week  ? Drug use: No  ? ? ?ROS ? ? ?Objective:  ? ?Vitals: ?BP 126/87 (BP Location: Left Arm)   Pulse 79   Temp 98.8 ?F (37.1 ?C) (Oral)   Resp 16   SpO2 98%  ? ?Physical Exam ?Constitutional:   ?   General: She is not in acute distress. ?   Appearance: Normal appearance. She is well-developed and normal weight. She is not ill-appearing, toxic-appearing or diaphoretic.  ?HENT:  ?   Head: Normocephalic and atraumatic.  ?   Right Ear: Tympanic membrane, ear canal and external ear normal. No drainage or tenderness. No middle ear effusion. There is no impacted cerumen. Tympanic membrane is not erythematous.  ?   Left Ear: Ear canal and external ear normal. No drainage or tenderness.  No middle ear effusion. There is no impacted cerumen. Tympanic membrane is not erythematous.  ?   Ears:  ?   Comments: Left TM with a perforation over the  inferior posterior side with slight erythema about the area. ?   Nose: Congestion and rhinorrhea present.  ?   Mouth/Throat:  ?   Mouth: Mucous membranes are moist. No oral lesions.  ?   Pharynx: No pharyngeal swelling, oropharyngeal exudate, posterior oropharyngeal erythema or uvula swelling.  ?   Tonsils: No tonsillar exudate or tonsillar abscesses.  ?Eyes:  ?   General: No scleral icterus.    ?   Right eye: No discharge.     ?   Left eye: No discharge.  ?   Extraocular Movements: Extraocular movements intact.  ?   Right eye: Normal extraocular motion.  ?   Left eye: Normal extraocular motion.  ?   Conjunctiva/sclera: Conjunctivae normal.  ?Cardiovascular:  ?   Rate and Rhythm: Normal rate.  ?   Heart sounds: No  murmur heard. ?  No friction rub. No gallop.  ?Pulmonary:  ?   Effort: Pulmonary effort is normal. No respiratory distress.  ?   Breath sounds: No stridor. No wheezing, rhonchi or rales.  ?Chest:  ?   Chest wall: No tenderness.  ?Musculoskeletal:  ?   Cervical back: Normal range of motion and neck supple.  ?Lymphadenopathy:  ?   Cervical: No cervical adenopathy.  ?Skin: ?   General: Skin is warm and dry.  ?Neurological:  ?   General: No focal deficit present.  ?   Mental Status: She is alert and oriented to person, place, and time.  ?Psychiatric:     ?   Mood and Affect: Mood normal.     ?   Behavior: Behavior normal.  ? ? ?Assessment and Plan :  ? ?PDMP not reviewed this encounter. ? ?1. Acute sinusitis, recurrence not specified, unspecified location   ?2. Mild persistent asthma without complication   ?3. Allergic rhinitis, unspecified seasonality, unspecified trigger   ? ?I will manage patient for recurrent sinusitis with amoxicillin.  We discussed this at length and she has previously taken this.  The allergy listed for Augmentin is that she has an allergic reaction with clavulanic acid.  Therefore, we will use amoxicillin to address her recurrent sinusitis and possible persistent otitis media of the left side.  In the context of her allergic rhinitis and asthma offered an oral prednisone course.  Recommended she continue with Xyzal.  Add Flonase and pseudoephedrine once her symptoms have completely resolved. Counseled patient on potential for adverse effects with medications prescribed/recommended today, ER and return-to-clinic precautions discussed, patient verbalized understanding. ? ?  ?Wallis Bamberg, PA-C ?07/12/21 1009 ? ?

## 2021-07-12 NOTE — ED Triage Notes (Signed)
Pt here with sinus congestion and chest congestion for over a week. Pt requesting a renewal on Pro Air inhaler.  ?

## 2021-11-15 ENCOUNTER — Ambulatory Visit
Admission: EM | Admit: 2021-11-15 | Discharge: 2021-11-15 | Disposition: A | Discharge status: Home / Self Care | Payer: Managed Care, Other (non HMO) | Attending: Emergency Medicine | Admitting: Emergency Medicine

## 2021-11-15 DIAGNOSIS — J0140 Acute pansinusitis, unspecified: Secondary | ICD-10-CM | POA: Diagnosis not present

## 2021-11-15 MED ORDER — DOXYCYCLINE HYCLATE 100 MG PO CAPS: 100.0000 mg | ORAL_CAPSULE | Freq: Two times a day (BID) | ORAL | 0 refills | Status: AC

## 2021-11-15 MED ORDER — BENZONATATE 100 MG PO CAPS: 100.0000 mg | ORAL_CAPSULE | Freq: Three times a day (TID) | ORAL | 0 refills | Status: AC

## 2021-11-15 MED ORDER — PROMETHAZINE-DM 6.25-15 MG/5ML PO SYRP: 5.0000 mL | ORAL_SOLUTION | Freq: Four times a day (QID) | ORAL | 0 refills | Status: AC | PRN

## 2021-11-15 NOTE — Discharge Instructions (Signed)
You are being treated for a sinus infection  Begin use of doxycycline every morning and every evening for the next 10 days, ideally you will see improvement after 48 hours of medication use and steady progression from there  You may use Tessalon pill every 8 hours to help calm your coughing  You may use cough syrup every 6 hours for additional comfort, be mindful this medication may make you drowsy, if this occurs you may have to dose or use at that bedtime  Continue use of any of the over-the-counter medications that you have been somewhat helpful in addition to the medication above for symptom management  For the sensation of chest tightness please use your inhaler as directed for additional comfort if chest tightness worsens or you begin to have shortness of breath or wheezing please return for reevaluation and management  Your symptoms continue to persist or worsen even past use of your medicine please follow-up for reevaluation

## 2021-11-15 NOTE — ED Triage Notes (Signed)
Pt presents with c/o nasal congestion and ear fullness with cough for past 2 weeks

## 2021-11-16 NOTE — ED Provider Notes (Signed)
[name]    CSN: [id number] Arrival date & time: 11/15/21  1840      History   Chief Complaint Chief Complaint  Patient presents with   Nasal Congestion   Ear Fullness    HPI Kristin Rice is a 36 y.o. female.   Patient presents with a nonproductive cough, chest tightness, nasal congestion, diarrhea, sinus pain and pressure, bilateral ear fullness for 2 weeks.  Tolerating food and liquids.  Has attempted use of Mucinex, pseudoephedrine, sinus cold and flu which have been minimally effective.  History of asthma, migraines, allergies.  Denies shortness of breath or wheezing.  Past Medical History:  Diagnosis Date   Allergy    Arthritis    Asthma    COVID-19 07/2020   Migraines     There are no problems to display for this patient.   Past Surgical History:  Procedure Laterality Date   HAND SURGERY     KNEE SURGERY      OB History   No obstetric history on file.      Home Medications    Prior to Admission medications   Medication Sig Start Date End Date Taking? Authorizing Provider  benzonatate (TESSALON) 100 MG capsule Take 1 capsule (100 mg total) by mouth every 8 (eight) hours. 11/15/21  Yes Dedra Matsuo, Elita Boone, NP  doxycycline (VIBRAMYCIN) 100 MG capsule Take 1 capsule (100 mg total) by mouth 2 (two) times daily for 10 days. 11/15/21 11/25/21 Yes Moise Friday R, NP  promethazine-dextromethorphan (PROMETHAZINE-DM) 6.25-15 MG/5ML syrup Take 5 mLs by mouth 4 (four) times daily as needed for cough. 11/15/21  Yes Santo Zahradnik R, NP  albuterol (VENTOLIN HFA) 108 (90 Base) MCG/ACT inhaler Inhale 2 puffs into the lungs every 6 (six) hours as needed for wheezing. 07/12/21   Wallis Bamberg, PA-C  amoxicillin (AMOXIL) 875 MG tablet Take 1 tablet (875 mg total) by mouth 2 (two) times daily. 07/12/21   Wallis Bamberg, PA-C  APAP-Isometheptene-Dichloral (MIDRIN PO) Take by mouth.    [provider]  azithromycin (ZITHROMAX) 250 MG tablet Take 1 tablet  (250 mg total) by mouth daily. Take first 2 tablets together, then 1 every day until finished. 03/20/21   Mickie Bail, NP  fexofenadine (ALLEGRA) 180 MG tablet Take 180 mg by mouth daily.    [provider]  fluticasone (FLONASE) 50 MCG/ACT nasal spray Place 2 sprays into the nose daily.    [provider]  HYDROcodone-acetaminophen (NORCO) 5-325 MG per tablet Take 1 tablet by mouth every 6 (six) hours as needed for pain. 08/25/12   Sherren Mocha, MD  levocetirizine (XYZAL) 5 MG tablet Take 1 tablet (5 mg total) by mouth every evening. 07/12/21   Wallis Bamberg, PA-C  predniSONE (DELTASONE) 20 MG tablet Take 2 tablets daily with breakfast. 07/12/21   Wallis Bamberg, PA-C  topiramate (TOPAMAX) 200 MG tablet Take 200 mg by mouth 2 (two) times daily.    [provider]    Family History Family History  Problem Relation Age of Onset   Diabetes Mother    Heart disease Father    Allergies Father    Allergies Brother     Social History Social History   Tobacco Use   Smoking status: Never   Smokeless tobacco: Never  Vaping Use   Vaping Use: Never used  Substance Use Topics   Alcohol use: Yes    Alcohol/week: 1.0 standard drink of alcohol    Types: 1 drink(s) per week  Drug use: No     Allergies   Augmentin [amoxicillin-pot clavulanate], Avelox [moxifloxacin hcl in nacl], and Relpax [eletriptan]   Review of Systems Review of Systems Defer to HPI   Physical Exam Triage Vital Signs ED Triage Vitals  Enc Vitals Group     BP 11/15/21 1900 128/87     Pulse Rate 11/15/21 1900 98     Resp 11/15/21 1900 18     Temp 11/15/21 1900 98.2 F (36.8 C)     Temp src --      SpO2 11/15/21 1900 97 %     Weight --      Height --      Head Circumference --      Peak Flow --      Pain Score 11/15/21 1858 0     Pain Loc --      Pain Edu? --      Excl. in [place]? --    No data found.  Updated Vital Signs BP 128/87   Pulse 98   Temp 98.2 F (36.8 C)   Resp 18   LMP  11/01/2021   SpO2 97%   Visual Acuity Right Eye Distance:   Left Eye Distance:   Bilateral Distance:    Right Eye Near:   Left Eye Near:    Bilateral Near:     Physical Exam Constitutional:      Appearance: Normal appearance.  HENT:     Head: Normocephalic.     Right Ear: Tympanic membrane, ear canal and external ear normal.     Left Ear: Tympanic membrane, ear canal and external ear normal.     Nose: Congestion and rhinorrhea present.     Right Sinus: Maxillary sinus tenderness and frontal sinus tenderness present.     Left Sinus: Maxillary sinus tenderness and frontal sinus tenderness present.     Mouth/Throat:     Mouth: Mucous membranes are moist.     Pharynx: Oropharynx is clear.  Eyes:     Extraocular Movements: Extraocular movements intact.  Cardiovascular:     Rate and Rhythm: Normal rate and regular rhythm.     Pulses: Normal pulses.     Heart sounds: Normal heart sounds.  Pulmonary:     Effort: Pulmonary effort is normal.     Breath sounds: Normal breath sounds.  Skin:    General: Skin is warm and dry.  Neurological:     Mental Status: She is alert and oriented to person, place, and time. Mental status is at baseline.  Psychiatric:        Mood and Affect: Mood normal.        Behavior: Behavior normal.      UC Treatments / Results  Labs (all labs ordered are listed, but only abnormal results are displayed) Labs Reviewed - No data to display  EKG   Radiology No results found.  Procedures Procedures (including critical care time)  Medications Ordered in UC Medications - No data to display  Initial Impression / Assessment and Plan / UC Course  I have reviewed the triage vital signs and the nursing notes.  Pertinent labs & imaging results that were available during my care of the patient were reviewed by me and considered in my medical decision making (see chart for details).  Acute nonrecurrent pansinusitis  Vital signs are stable patient is  in no signs of distress nor toxic appearing, presentation and symptomology is consistent with a sinusitis as symptoms have been present for  2 weeks we will provide bacterial coverage, doxycycline prescribed as well as Tessalon and Promethazine DM as cough is the most worrisome symptom today as it has begun to interfere with sleep, may continue use of additional over-the-counter medications that have been deemed helpful with follow-up with urgent care as needed Final Clinical Impressions(s) / UC Diagnoses   Final diagnoses:  Acute non-recurrent pansinusitis     Discharge Instructions      You are being treated for a sinus infection  Begin use of doxycycline every morning and every evening for the next 10 days, ideally you will see improvement after 48 hours of medication use and steady progression from there  You may use Tessalon pill every 8 hours to help calm your coughing  You may use cough syrup every 6 hours for additional comfort, be mindful this medication may make you drowsy, if this occurs you may have to dose or use at that bedtime  Continue use of any of the over-the-counter medications that you have been somewhat helpful in addition to the medication above for symptom management  For the sensation of chest tightness please use your inhaler as directed for additional comfort if chest tightness worsens or you begin to have shortness of breath or wheezing please return for reevaluation and management  Your symptoms continue to persist or worsen even past use of your medicine please follow-up for reevaluation   ED Prescriptions     Medication Sig Dispense Auth. Provider   doxycycline (VIBRAMYCIN) 100 MG capsule Take 1 capsule (100 mg total) by mouth 2 (two) times daily for 10 days. 20 capsule Fate Caster R, NP   benzonatate (TESSALON) 100 MG capsule Take 1 capsule (100 mg total) by mouth every 8 (eight) hours. 21 capsule Rayana Geurin R, NP   promethazine-dextromethorphan  (PROMETHAZINE-DM) 6.25-15 MG/5ML syrup Take 5 mLs by mouth 4 (four) times daily as needed for cough. 118 mL Octavis Sheeler, Elita Boone, NP      PDMP not reviewed this encounter.   Valinda Hoar, [place] 11/16/21 [phone]

## 2022-05-26 ENCOUNTER — Ambulatory Visit
Admission: EM | Admit: 2022-05-26 | Discharge: 2022-05-26 | Disposition: A | Discharge status: Home / Self Care | Payer: Managed Care, Other (non HMO) | Attending: Urgent Care | Admitting: Urgent Care

## 2022-05-26 ENCOUNTER — Encounter: Payer: Self-pay | Admitting: Emergency Medicine

## 2022-05-26 DIAGNOSIS — G43911 Migraine, unspecified, intractable, with status migrainosus: Secondary | ICD-10-CM | POA: Diagnosis not present

## 2022-05-26 MED ORDER — DEXAMETHASONE SODIUM PHOSPHATE 10 MG/ML IJ SOLN
10.0000 mg | Freq: Once | INTRAMUSCULAR | Status: AC
  Administered 2022-05-26: 10 mg via INTRAMUSCULAR

## 2022-05-26 MED ORDER — KETOROLAC TROMETHAMINE 30 MG/ML IJ SOLN
30.0000 mg | Freq: Once | INTRAMUSCULAR | Status: AC
  Administered 2022-05-26: 30 mg via INTRAMUSCULAR

## 2022-05-26 MED ORDER — PROMETHAZINE HCL 25 MG PO TABS: 25.0000 mg | ORAL_TABLET | Freq: Four times a day (QID) | ORAL | 0 refills | Status: AC | PRN

## 2022-05-26 NOTE — ED Provider Notes (Signed)
Roderic Palau    CSN: [id number] Arrival date & time: 05/26/22  1521      History   Chief Complaint Chief Complaint  Patient presents with   Migraine    HPI Stephnie Ranieri-Lewis is a 37 y.o. female.    Migraine    Patient presents with migraine, not responding to prescribed treatment, since days.  Requesting abortive treatment.  Past Medical History:  Diagnosis Date   Allergy    Arthritis    Asthma    COVID-19 07/2020   Migraines     There are no problems to display for this patient.   Past Surgical History:  Procedure Laterality Date   HAND SURGERY     KNEE SURGERY     MIDDLE EAR SURGERY      OB History   No obstetric history on file.      Home Medications    Prior to Admission medications   Medication Sig Start Date End Date Taking? Authorizing Provider  albuterol (VENTOLIN HFA) 108 (90 Base) MCG/ACT inhaler Inhale 2 puffs into the lungs every 6 (six) hours as needed for wheezing. 07/12/21   Jaynee Eagles, PA-C  amoxicillin (AMOXIL) 875 MG tablet Take 1 tablet (875 mg total) by mouth 2 (two) times daily. 07/12/21   Jaynee Eagles, PA-C  APAP-Isometheptene-Dichloral (MIDRIN PO) Take by mouth.    [provider]  azithromycin (ZITHROMAX) 250 MG tablet Take 1 tablet (250 mg total) by mouth daily. Take first 2 tablets together, then 1 every day until finished. 03/20/21   Sharion Balloon, NP  benzonatate (TESSALON) 100 MG capsule Take 1 capsule (100 mg total) by mouth every 8 (eight) hours. 11/15/21   White, Leitha Schuller, NP  fexofenadine (ALLEGRA) 180 MG tablet Take 180 mg by mouth daily.    [provider]  fluticasone (FLONASE) 50 MCG/ACT nasal spray Place 2 sprays into the nose daily.    [provider]  HYDROcodone-acetaminophen (NORCO) 5-325 MG per tablet Take 1 tablet by mouth every 6 (six) hours as needed for pain. 08/25/12   Shawnee Knapp, MD  levocetirizine (XYZAL) 5 MG tablet Take 1 tablet (5 mg total) by mouth every evening.  07/12/21   Jaynee Eagles, PA-C  predniSONE (DELTASONE) 20 MG tablet Take 2 tablets daily with breakfast. 07/12/21   Jaynee Eagles, PA-C  promethazine-dextromethorphan (PROMETHAZINE-DM) 6.25-15 MG/5ML syrup Take 5 mLs by mouth 4 (four) times daily as needed for cough. 11/15/21   White, Leitha Schuller, NP  topiramate (TOPAMAX) 200 MG tablet Take 200 mg by mouth 2 (two) times daily.    [provider]    Family History Family History  Problem Relation Age of Onset   Diabetes Mother    Heart disease Father    Allergies Father    Allergies Brother     Social History Social History   Tobacco Use   Smoking status: Never   Smokeless tobacco: Never  Vaping Use   Vaping Use: Never used  Substance Use Topics   Alcohol use: Yes    Alcohol/week: 1.0 standard drink of alcohol    Types: 1 drink(s) per week   Drug use: No     Allergies   Augmentin [amoxicillin-pot clavulanate], Avelox [moxifloxacin hcl in nacl], and Relpax [eletriptan]   Review of Systems Review of Systems   Physical Exam Triage Vital Signs ED Triage Vitals  Enc Vitals Group     BP 05/26/22 1535 124/89     Pulse Rate 05/26/22 1535 74  Resp 05/26/22 1535 16     Temp 05/26/22 1535 98.5 F (36.9 C)     Temp Source 05/26/22 1535 Oral     SpO2 05/26/22 1535 96 %     Weight --      Height --      Head Circumference --      Peak Flow --      Pain Score 05/26/22 1538 8     Pain Loc --      Pain Edu? --      Excl. in [place]? --    No data found.  Updated Vital Signs BP 124/89 (BP Location: Left Arm)   Pulse 74   Temp 98.5 F (36.9 C) (Oral)   Resp 16   SpO2 96%   Visual Acuity Right Eye Distance:   Left Eye Distance:   Bilateral Distance:    Right Eye Near:   Left Eye Near:    Bilateral Near:     Physical Exam Vitals reviewed.  Constitutional:      General: She is in acute distress.     Appearance: She is ill-appearing.  Skin:    General: Skin is warm and dry.  Neurological:     General: No  focal deficit present.     Mental Status: She is alert and oriented to person, place, and time.  Psychiatric:        Mood and Affect: Mood normal.        Behavior: Behavior normal.      UC Treatments / Results  Labs (all labs ordered are listed, but only abnormal results are displayed) Labs Reviewed - No data to display  EKG   Radiology No results found.  Procedures Procedures (including critical care time)  Medications Ordered in UC Medications - No data to display  Initial Impression / Assessment and Plan / UC Course  I have reviewed the triage vital signs and the nursing notes.  Pertinent labs & imaging results that were available during my care of the patient were reviewed by me and considered in my medical decision making (see chart for details).   Patient is afebrile here without recent antipyretics. Satting well on room air. Overall is well appearing, well hydrated, without respiratory distress.  Exam is grossly normal.  Patient in some distress.  Sitting in room with lights off.  Treating acute symptoms with Toradol 30 mg IM.  Prophylactic treatment with Decadron 10 mg IM.  Discharging with promethazine 25 mg p.o.    Final Clinical Impressions(s) / UC Diagnoses   Final diagnoses:  None   Discharge Instructions   None    ED Prescriptions   None    PDMP not reviewed this encounter.   Rose Phi, [place] 05/26/22 1549

## 2022-05-26 NOTE — ED Triage Notes (Signed)
Has been on Emgality injection and Neurtec for migraines - this migraine started Wednesday, Neurtec not helping. Requesting injection to help with migraine symptoms.

## 2022-05-26 NOTE — Discharge Instructions (Signed)
Follow up here or with your primary care provider if your symptoms are worsening or not improving.    

## 2022-07-23 ENCOUNTER — Ambulatory Visit
Admission: EM | Admit: 2022-07-23 | Discharge: 2022-07-23 | Disposition: A | Discharge status: Home / Self Care | Payer: Managed Care, Other (non HMO)

## 2022-07-23 DIAGNOSIS — J0100 Acute maxillary sinusitis, unspecified: Secondary | ICD-10-CM

## 2022-07-23 MED ORDER — AMOXICILLIN 875 MG PO TABS: 875.0000 mg | ORAL_TABLET | Freq: Two times a day (BID) | ORAL | 0 refills | Status: AC

## 2022-07-23 NOTE — Discharge Instructions (Addendum)
Take the amoxicillin as directed.  Follow up with your primary care provider if your symptoms are not improving.   ° ° °

## 2022-07-23 NOTE — ED Triage Notes (Signed)
Patient to Urgent Care with complaints of nasal congestion/ sinus pain and pressure/ left sided ear pain. Denies any known fevers. Reports concerns about a sinus infection.   Symptoms started approx one week ago. Has been taking xyzal/ flonase/ afrin/ mucinex-D.

## 2022-07-23 NOTE — ED Provider Notes (Signed)
Kristin Rice    CSN: [id number] Arrival date & time: 07/23/22  1704      History   Chief Complaint Chief Complaint  Patient presents with   Nasal Congestion    HPI Kristin Rice is a 37 y.o. female.  Patient presents with 1 week history of left ear pain, sinus pressure, nasal congestion, cough.  Treatment attempted with OTC allergy medications.  She denies fever, sore throat, shortness of breath, vomiting, diarrhea, or other symptoms.  Patient is followed by ENT; last seen on 06/11/2022  The history is provided by the patient and medical records.    Past Medical History:  Diagnosis Date   Allergy    Arthritis    Asthma    COVID-19 07/2020   Migraines     There are no problems to display for this patient.   Past Surgical History:  Procedure Laterality Date   HAND SURGERY     KNEE SURGERY     MIDDLE EAR SURGERY      OB History   No obstetric history on file.      Home Medications    Prior to Admission medications   Medication Sig Start Date End Date Taking? Authorizing Provider  amoxicillin (AMOXIL) 875 MG tablet Take 1 tablet (875 mg total) by mouth 2 (two) times daily for 10 days. 07/23/22 08/02/22 Yes Mickie Bail, NP  Azelastine-Fluticasone 137-50 MCG/ACT SUSP  05/24/19  Yes [provider]  albuterol (VENTOLIN HFA) 108 (90 Base) MCG/ACT inhaler Inhale 2 puffs into the lungs every 6 (six) hours as needed for wheezing. 07/12/21   Wallis Bamberg, PA-C  APAP-Isometheptene-Dichloral (MIDRIN PO) Take by mouth. Patient not taking: Reported on 07/23/2022    [provider]  benzonatate (TESSALON) 100 MG capsule Take 1 capsule (100 mg total) by mouth every 8 (eight) hours. Patient not taking: Reported on 07/23/2022 11/15/21   Valinda Hoar, NP  dextromethorphan-guaiFENesin [facility] DM) 30-600 MG 12hr tablet Take 1 tablet by mouth every 12 (twelve) hours.    [provider]  fexofenadine (ALLEGRA) 180 MG tablet Take 180 mg by  mouth daily. Patient not taking: Reported on 07/23/2022    [provider]  fluticasone (FLONASE) 50 MCG/ACT nasal spray Place 2 sprays into the nose daily.    [provider]  HYDROcodone-acetaminophen (NORCO) 5-325 MG per tablet Take 1 tablet by mouth every 6 (six) hours as needed for pain. Patient not taking: Reported on 07/23/2022 08/25/12   Sherren Mocha, MD  levocetirizine (XYZAL) 5 MG tablet Take 1 tablet (5 mg total) by mouth every evening. 07/12/21   Wallis Bamberg, PA-C  predniSONE (DELTASONE) 20 MG tablet Take 2 tablets daily with breakfast. Patient not taking: Reported on 07/23/2022 07/12/21   Wallis Bamberg, PA-C  promethazine (PHENERGAN) 25 MG tablet Take 1 tablet (25 mg total) by mouth every 6 (six) hours as needed for nausea or vomiting. Patient not taking: Reported on 07/23/2022 05/26/22   Immordino, Jeannett Senior, FNP  promethazine-dextromethorphan (PROMETHAZINE-DM) 6.25-15 MG/5ML syrup Take 5 mLs by mouth 4 (four) times daily as needed for cough. Patient not taking: Reported on 07/23/2022 11/15/21   Valinda Hoar, NP  Rimegepant Sulfate (NURTEC) 75 MG TBDP Take by mouth.    [provider]  topiramate (TOPAMAX) 200 MG tablet Take 200 mg by mouth 2 (two) times daily. Patient not taking: Reported on 07/23/2022    [provider]    Family History Family History  Problem Relation Age of Onset  Diabetes Mother    Heart disease Father    Allergies Father    Allergies Brother     Social History Social History   Tobacco Use   Smoking status: Never   Smokeless tobacco: Never  Vaping Use   Vaping Use: Never used  Substance Use Topics   Alcohol use: Yes    Alcohol/week: 1.0 standard drink of alcohol    Types: 1 drink(s) per week   Drug use: No     Allergies   Augmentin [amoxicillin-pot clavulanate], Avelox [moxifloxacin hcl in nacl], and Relpax [eletriptan]   Review of Systems Review of Systems  Constitutional:  Negative for chills and fever.   HENT:  Positive for congestion, ear pain, postnasal drip, rhinorrhea and sinus pressure. Negative for sore throat.   Respiratory:  Positive for cough. Negative for shortness of breath.   Cardiovascular:  Negative for chest pain and palpitations.  Gastrointestinal:  Negative for diarrhea and vomiting.  Skin:  Negative for rash.  All other systems reviewed and are negative.    Physical Exam Triage Vital Signs ED Triage Vitals  Enc Vitals Group     BP 07/23/22 1756 133/82     Pulse Rate 07/23/22 1743 94     Resp 07/23/22 1743 18     Temp 07/23/22 1743 98.1 F (36.7 C)     Temp src --      SpO2 07/23/22 1743 97 %     Weight --      Height --      Head Circumference --      Peak Flow --      Pain Score 07/23/22 1746 7     Pain Loc --      Pain Edu? --      Excl. in [place]? --    No data found.  Updated Vital Signs BP 133/82   Pulse 94   Temp 98.1 F (36.7 C)   Resp 18   LMP 07/16/2022   SpO2 97%   Visual Acuity Right Eye Distance:   Left Eye Distance:   Bilateral Distance:    Right Eye Near:   Left Eye Near:    Bilateral Near:     Physical Exam Vitals and nursing note reviewed.  Constitutional:      General: She is not in acute distress.    Appearance: Normal appearance. She is well-developed. She is not ill-appearing.  HENT:     Right Ear: Tympanic membrane normal.     Left Ear: Tympanic membrane normal.     Nose: Congestion present.     Mouth/Throat:     Mouth: Mucous membranes are moist.     Pharynx: Oropharynx is clear.  Cardiovascular:     Rate and Rhythm: Normal rate and regular rhythm.     Heart sounds: Normal heart sounds.  Pulmonary:     Effort: Pulmonary effort is normal. No respiratory distress.     Breath sounds: Normal breath sounds.  Musculoskeletal:        General: No swelling.     Cervical back: Neck supple.  Skin:    General: Skin is warm and dry.  Neurological:     Mental Status: She is alert.  Psychiatric:        Mood and Affect:  Mood normal.        Behavior: Behavior normal.      UC Treatments / Results  Labs (all labs ordered are listed, but only abnormal results are displayed) Labs Reviewed -  No data to display  EKG   Radiology No results found.  Procedures Procedures (including critical care time)  Medications Ordered in UC Medications - No data to display  Initial Impression / Assessment and Plan / UC Course  I have reviewed the triage vital signs and the nursing notes.  Pertinent labs & imaging results that were available during my care of the patient were reviewed by me and considered in my medical decision making (see chart for details).    Acute sinusitis.  Patient has been symptomatic for 1 week; no improvement with OTC medication.  Treating today with amoxicillin.  (Patient has a listed allergy to Augmentin but she states this is to clavulanate.  She is able to take amoxicillin without problem.)  Tylenol or ibuprofen as needed.  Instructed patient to follow up with her PCP if her symptoms are not improving.  She agrees to plan of care.    Final Clinical Impressions(s) / UC Diagnoses   Final diagnoses:  Acute non-recurrent maxillary sinusitis     Discharge Instructions      Take the amoxicillin as directed.  Follow up with your primary care provider if your symptoms are not improving.        ED Prescriptions     Medication Sig Dispense Auth. Provider   amoxicillin (AMOXIL) 875 MG tablet Take 1 tablet (875 mg total) by mouth 2 (two) times daily for 10 days. 20 tablet Mickie Bail, NP      PDMP not reviewed this encounter.   Mickie Bail, NP 07/23/22 [phone]

## 2022-11-22 ENCOUNTER — Ambulatory Visit
Admission: EM | Admit: 2022-11-22 | Discharge: 2022-11-22 | Disposition: A | Discharge status: Home / Self Care | Payer: Managed Care, Other (non HMO) | Attending: Emergency Medicine | Admitting: Emergency Medicine

## 2022-11-22 DIAGNOSIS — G43909 Migraine, unspecified, not intractable, without status migrainosus: Secondary | ICD-10-CM

## 2022-11-22 MED ORDER — KETOROLAC TROMETHAMINE 30 MG/ML IJ SOLN
30.0000 mg | Freq: Once | INTRAMUSCULAR | Status: AC
  Administered 2022-11-22: 30 mg via INTRAMUSCULAR

## 2022-11-22 MED ORDER — DEXAMETHASONE SODIUM PHOSPHATE 10 MG/ML IJ SOLN
10.0000 mg | Freq: Once | INTRAMUSCULAR | Status: AC
  Administered 2022-11-22: 10 mg via INTRAMUSCULAR

## 2022-11-22 NOTE — ED Provider Notes (Signed)
Renaldo Rice    CSN: [id number] Arrival date & time: 11/22/22  1941      History   Chief Complaint Chief Complaint  Patient presents with   Headache    HPI Kristin Rice is a 37 y.o. female.  Patient presents with 3-day history of migraine headache.  She states this is her typical migraine.  No falls or injury.  This is not the worst headache of her life.  No fever, chills, shortness of breath, chest pain, numbness, weakness, or other symptoms.  Treatment attempted with Nurtec.  No OTC medications taken today.  Her medical history includes migraine headaches. She denies current pregnancy or breastfeeding.     The history is provided by the patient and medical records.    Past Medical History:  Diagnosis Date   Allergy    Arthritis    Asthma    COVID-19 07/2020   Migraines     There are no problems to display for this patient.   Past Surgical History:  Procedure Laterality Date   HAND SURGERY     KNEE SURGERY     MIDDLE EAR SURGERY      OB History   No obstetric history on file.      Home Medications    Prior to Admission medications   Medication Sig Start Date End Date Taking? Authorizing Provider  albuterol (VENTOLIN HFA) 108 (90 Base) MCG/ACT inhaler Inhale 2 puffs into the lungs every 6 (six) hours as needed for wheezing. 07/12/21   Wallis Bamberg, PA-C  APAP-Isometheptene-Dichloral (MIDRIN PO) Take by mouth. Patient not taking: Reported on 07/23/2022    [provider]  Azelastine-Fluticasone 137-50 MCG/ACT SUSP  05/24/19   [provider]  benzonatate (TESSALON) 100 MG capsule Take 1 capsule (100 mg total) by mouth every 8 (eight) hours. Patient not taking: Reported on 07/23/2022 11/15/21   Valinda Hoar, NP  dextromethorphan-guaiFENesin [facility] DM) 30-600 MG 12hr tablet Take 1 tablet by mouth every 12 (twelve) hours. Patient not taking: Reported on 11/22/2022    [provider]  fexofenadine (ALLEGRA) 180 MG  tablet Take 180 mg by mouth daily. Patient not taking: Reported on 07/23/2022    [provider]  fluticasone (FLONASE) 50 MCG/ACT nasal spray Place 2 sprays into the nose daily.    [provider]  HYDROcodone-acetaminophen (NORCO) 5-325 MG per tablet Take 1 tablet by mouth every 6 (six) hours as needed for pain. Patient not taking: Reported on 07/23/2022 08/25/12   Sherren Mocha, MD  levocetirizine (XYZAL) 5 MG tablet Take 1 tablet (5 mg total) by mouth every evening. 07/12/21   Wallis Bamberg, PA-C  predniSONE (DELTASONE) 20 MG tablet Take 2 tablets daily with breakfast. Patient not taking: Reported on 07/23/2022 07/12/21   Wallis Bamberg, PA-C  promethazine (PHENERGAN) 25 MG tablet Take 1 tablet (25 mg total) by mouth every 6 (six) hours as needed for nausea or vomiting. Patient not taking: Reported on 07/23/2022 05/26/22   Immordino, Jeannett Senior, FNP  promethazine-dextromethorphan (PROMETHAZINE-DM) 6.25-15 MG/5ML syrup Take 5 mLs by mouth 4 (four) times daily as needed for cough. Patient not taking: Reported on 07/23/2022 11/15/21   Valinda Hoar, NP  Rimegepant Sulfate (NURTEC) 75 MG TBDP Take by mouth.    [provider]  topiramate (TOPAMAX) 200 MG tablet Take 200 mg by mouth 2 (two) times daily. Patient not taking: Reported on 07/23/2022    [provider]    Family History Family History  Problem Relation  Age of Onset   Diabetes Mother    Heart disease Father    Allergies Father    Allergies Brother     Social History Social History   Tobacco Use   Smoking status: Never   Smokeless tobacco: Never  Vaping Use   Vaping status: Never Used  Substance Use Topics   Alcohol use: Yes    Alcohol/week: 1.0 standard drink of alcohol    Types: 1 drink(s) per week   Drug use: No     Allergies   Augmentin [amoxicillin-pot clavulanate], Avelox [moxifloxacin hcl in nacl], and Relpax [eletriptan]   Review of Systems Review of Systems  Constitutional:  Negative  for chills and fever.  Respiratory:  Negative for cough and shortness of breath.   Cardiovascular:  Negative for chest pain and palpitations.  Neurological:  Positive for headaches. Negative for dizziness, seizures, syncope, facial asymmetry, speech difficulty, weakness and numbness.     Physical Exam Triage Vital Signs ED Triage Vitals  Encounter Vitals Group     BP 11/22/22 1949 121/84     Systolic BP Percentile --      Diastolic BP Percentile --      Pulse Rate 11/22/22 1946 (!) 108     Resp 11/22/22 1946 18     Temp 11/22/22 1946 98 F (36.7 C)     Temp src --      SpO2 11/22/22 1946 98 %     Weight --      Height --      Head Circumference --      Peak Flow --      Pain Score 11/22/22 1947 8     Pain Loc --      Pain Education --      Exclude from Growth Chart --    No data found.  Updated Vital Signs BP 121/84   Pulse (!) 108   Temp 98 F (36.7 C)   Resp 18   LMP 11/08/2022   SpO2 98%   Visual Acuity Right Eye Distance:   Left Eye Distance:   Bilateral Distance:    Right Eye Near:   Left Eye Near:    Bilateral Near:     Physical Exam Vitals and nursing note reviewed.  Constitutional:      General: She is not in acute distress.    Appearance: She is well-developed.  HENT:     Mouth/Throat:     Mouth: Mucous membranes are moist.  Eyes:     Pupils: Pupils are equal, round, and reactive to light.  Cardiovascular:     Rate and Rhythm: Normal rate and regular rhythm.     Heart sounds: Normal heart sounds.  Pulmonary:     Effort: Pulmonary effort is normal. No respiratory distress.     Breath sounds: Normal breath sounds.  Musculoskeletal:     Cervical back: Neck supple.  Skin:    General: Skin is warm and dry.  Neurological:     General: No focal deficit present.     Mental Status: She is alert and oriented to person, place, and time.     Cranial Nerves: No cranial nerve deficit.     Sensory: No sensory deficit.     Motor: No weakness.      Gait: Gait normal.  Psychiatric:        Mood and Affect: Mood normal.        Behavior: Behavior normal.      UC Treatments /  Results  Labs (all labs ordered are listed, but only abnormal results are displayed) Labs Reviewed - No data to display  EKG   Radiology No results found.  Procedures Procedures (including critical care time)  Medications Ordered in UC Medications  ketorolac (TORADOL) 30 MG/ML injection 30 mg (30 mg Intramuscular Given 11/22/22 2018)  dexamethasone (DECADRON) injection 10 mg (10 mg Intramuscular Given 11/22/22 2018)    Initial Impression / Assessment and Plan / UC Course  I have reviewed the triage vital signs and the nursing notes.  Pertinent labs & imaging results that were available during my care of the patient were reviewed by me and considered in my medical decision making (see chart for details).    Migraine headache.  Patient has been symptomatic for 3 days and did not improve with her migraine medication.  She states this is her typical migraine headache.  This is not the worst headache of her life.  No trauma.  Her exam today is reassuring.  Treating with dexamethasone and ketorolac as patient reports this has worked well in the past.  Instructed her to avoid OTC medications today.  Instructed her to follow-up with her PCP on Monday.  ED precautions given.  Education provided on migraine headache.  She agrees to plan of care.  Final Clinical Impressions(s) / UC Diagnoses   Final diagnoses:  Migraine without status migrainosus, not intractable, unspecified migraine type     Discharge Instructions      You were given an injection of dexamethasone and ketorolac.  Do not take any over-the-counter medications today.  Follow-up with your primary care on Monday.  Go to the emergency department if you have worsening symptoms.     ED Prescriptions   None    PDMP not reviewed this encounter.   Mickie Bail, NP 11/22/22 [phone]

## 2022-11-22 NOTE — ED Triage Notes (Addendum)
Patient to Urgent Care with complaints of a migraine headache that started three days ago.  Denies any other symptoms. Hx of migraines. No relief from prescription nurtec.

## 2022-11-22 NOTE — Discharge Instructions (Signed)
You were given an injection of dexamethasone and ketorolac.  Do not take any over-the-counter medications today.  Follow-up with your primary care on Monday.  Go to the emergency department if you have worsening symptoms.

## 2023-02-15 IMAGING — DX DG CHEST 2V
2 series · 2 of 2 positions shown · non-contrast
Comparison: Chest radiograph 03/02/2020

CLINICAL DATA: cough, SOB.  COVID 4 weeks ago.

EXAM:
CHEST - 2 VIEW

[chest pa]
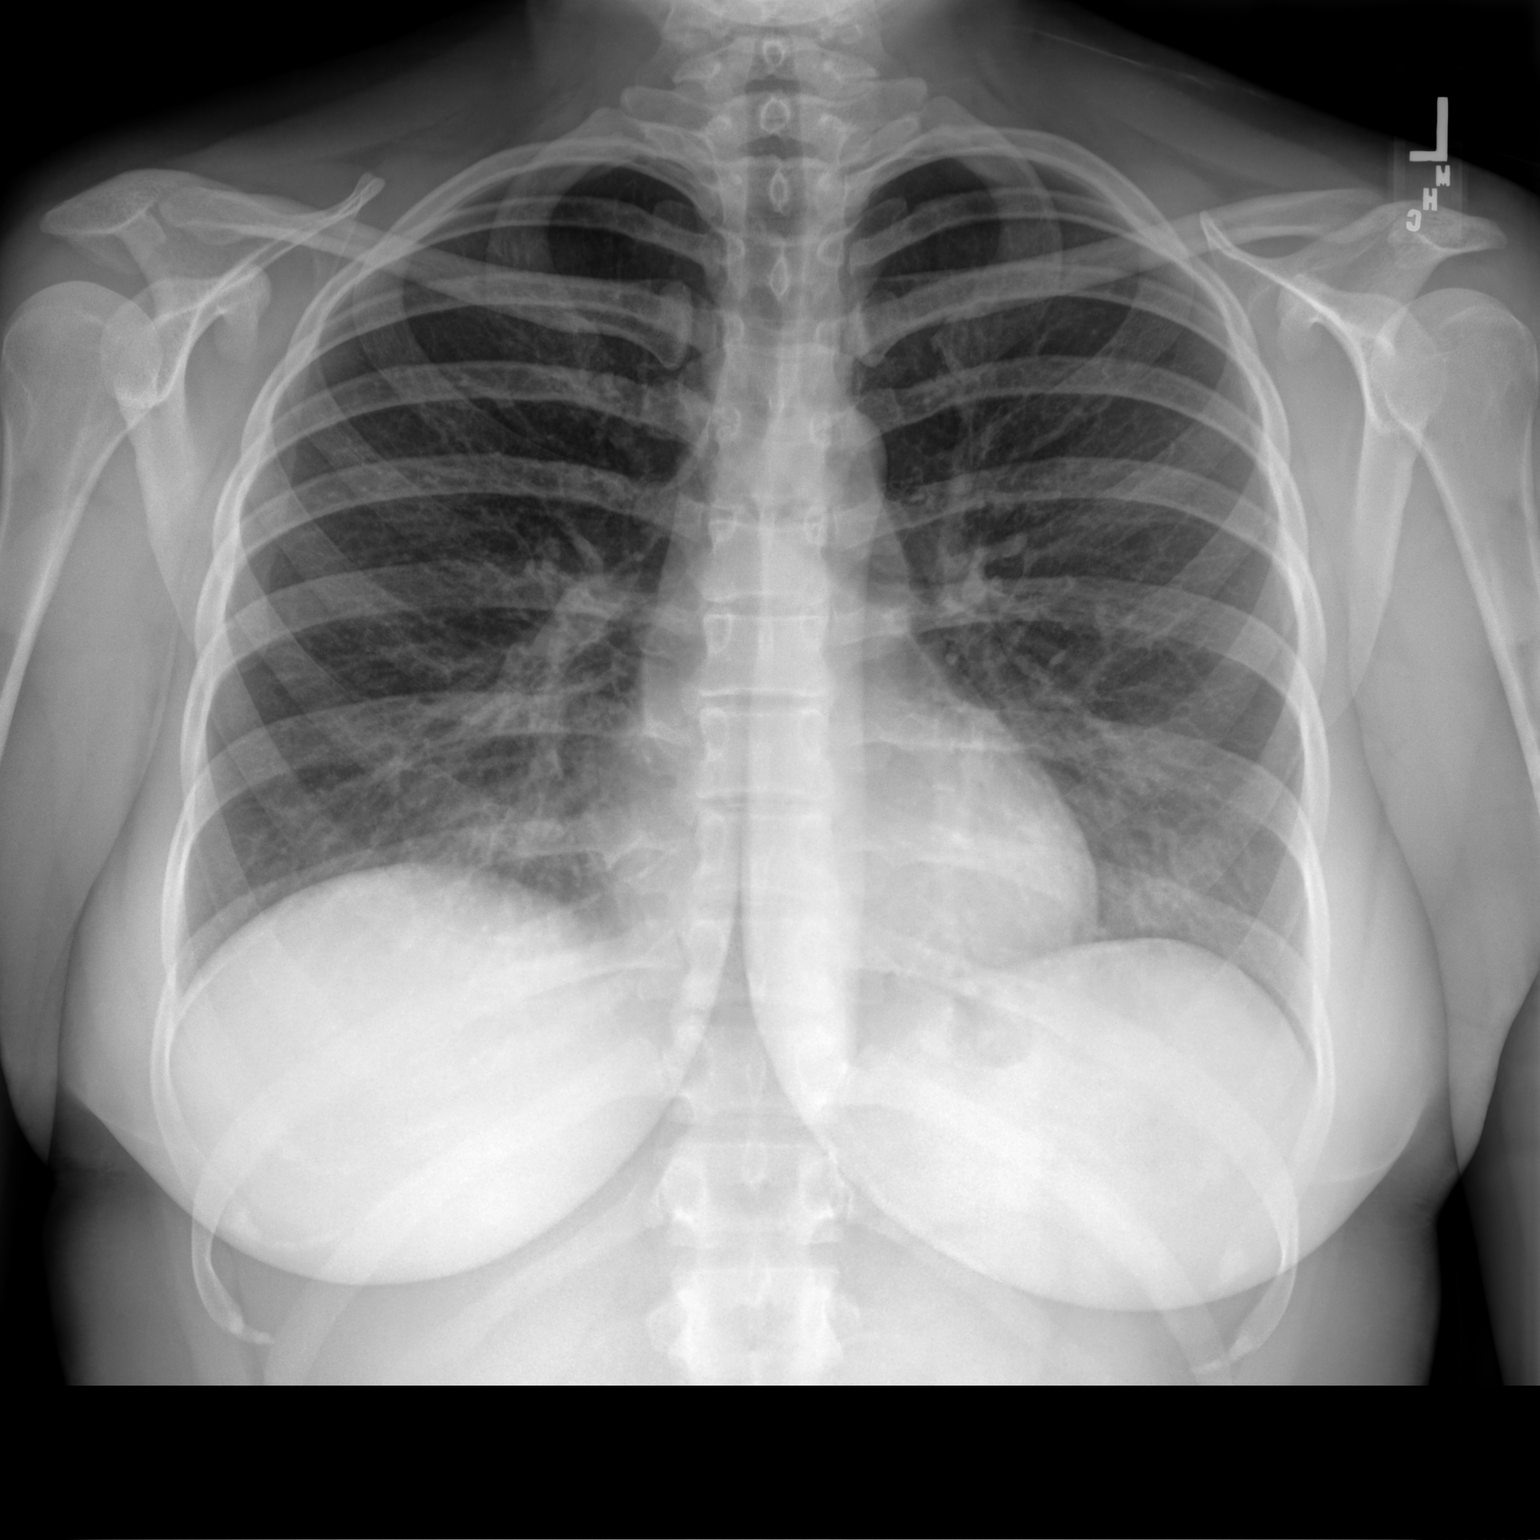

[chest lat]
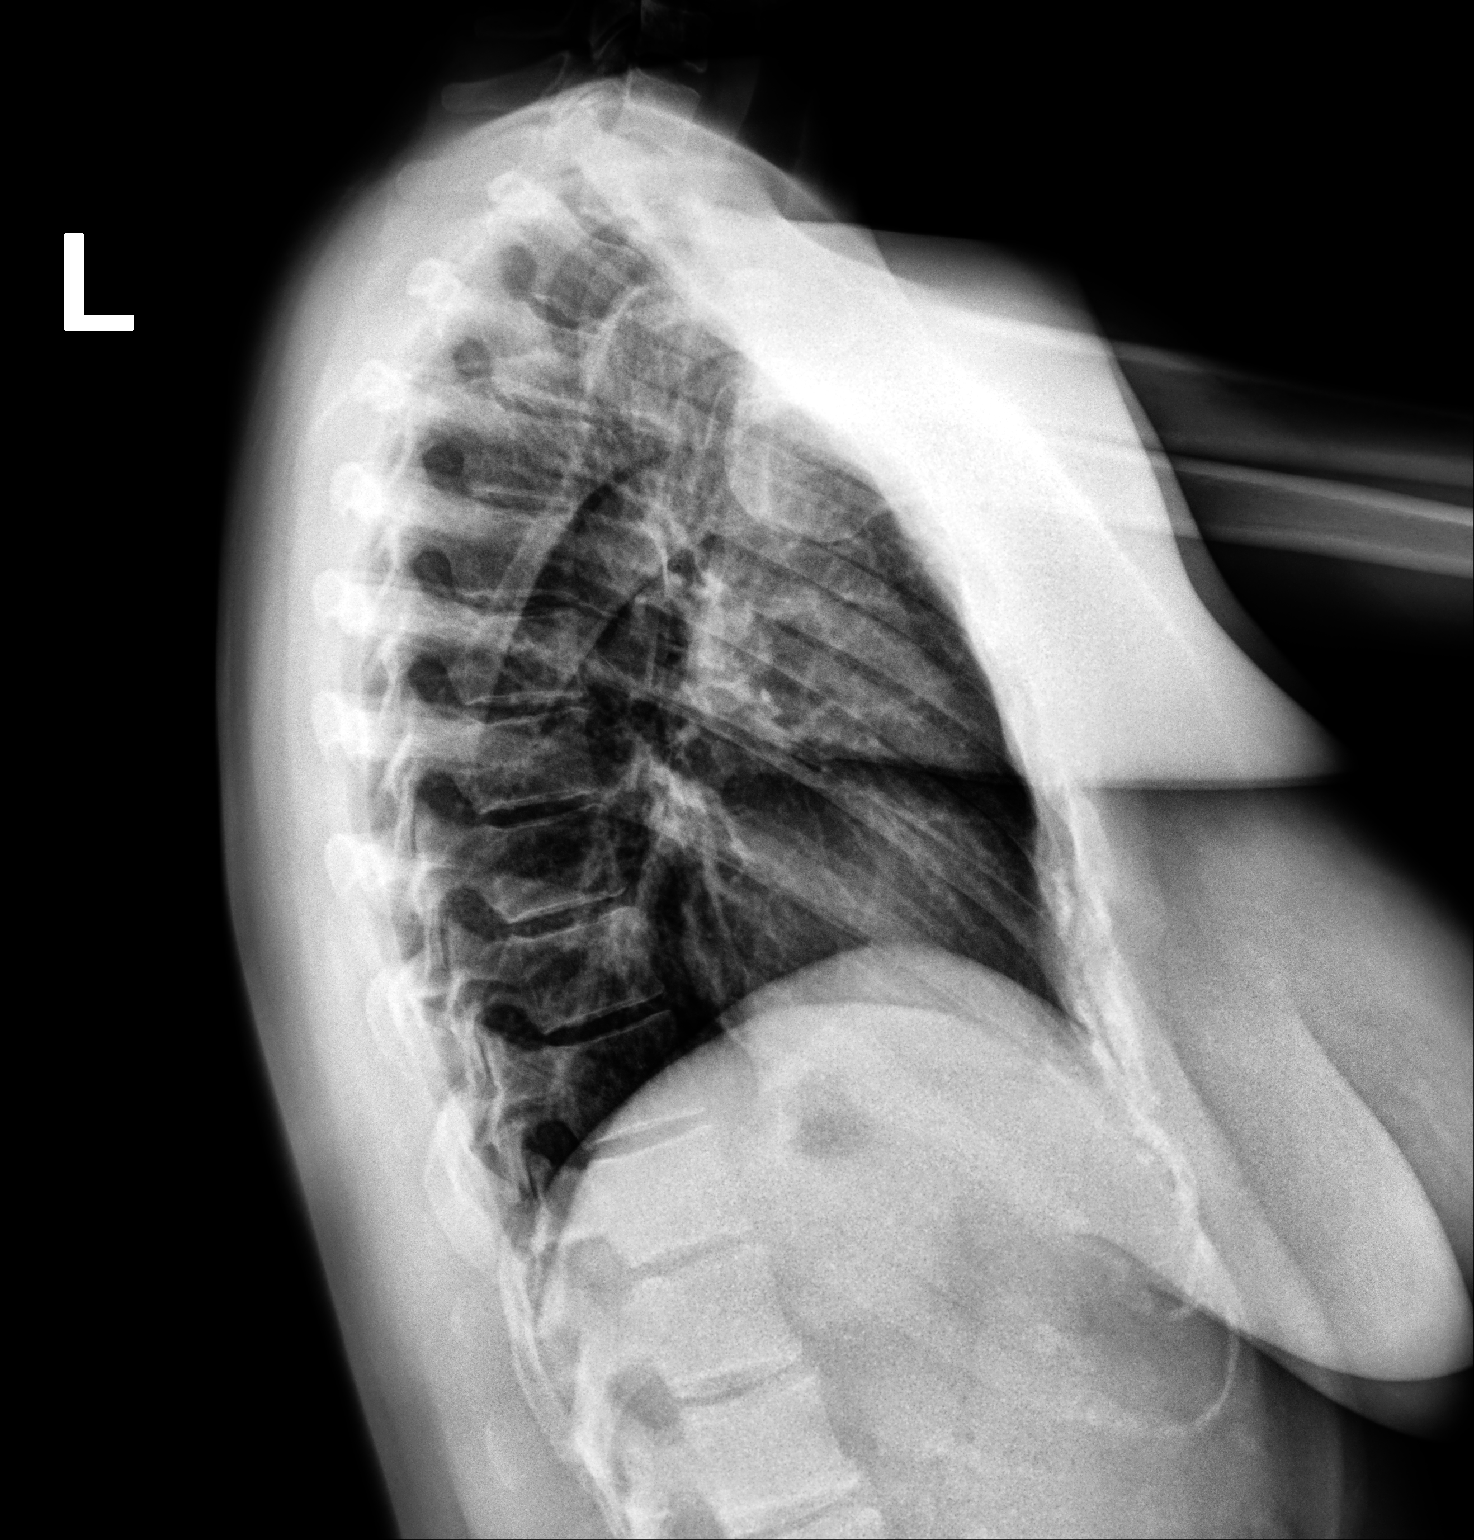

[2 of 2 positions shown; findings below may reference images not displayed]

FINDINGS: The heart size and mediastinal contours are within normal
limits.There are new faint bibasilar opacities, right greater than
left.No pleural effusion or pneumothorax.No acute osseous
abnormality.
IMPRESSION: New faint bibasilar opacities, right greater than left, suggestive
of infectious/inflammatory process.

## 2023-03-15 ENCOUNTER — Emergency Department (HOSPITAL_COMMUNITY)
Admission: EM | Admit: 2023-03-15 | Discharge: 2023-03-15 | Disposition: A | Discharge status: Home / Self Care | Payer: Managed Care, Other (non HMO) | Attending: Emergency Medicine | Admitting: Emergency Medicine

## 2023-03-15 ENCOUNTER — Encounter (HOSPITAL_COMMUNITY): Payer: Self-pay

## 2023-03-15 ENCOUNTER — Other Ambulatory Visit: Payer: Self-pay

## 2023-03-15 ENCOUNTER — Emergency Department (HOSPITAL_COMMUNITY): Payer: Managed Care, Other (non HMO)

## 2023-03-15 DIAGNOSIS — R0602 Shortness of breath: Secondary | ICD-10-CM | POA: Diagnosis present

## 2023-03-15 DIAGNOSIS — Z1152 Encounter for screening for COVID-19: Secondary | ICD-10-CM | POA: Insufficient documentation

## 2023-03-15 DIAGNOSIS — J45901 Unspecified asthma with (acute) exacerbation: Secondary | ICD-10-CM | POA: Insufficient documentation

## 2023-03-15 LAB — RESP PANEL BY RT-PCR (RSV, FLU A&B, COVID)  RVPGX2
Influenza A by PCR: NEGATIVE
Influenza B by PCR: NEGATIVE
Resp Syncytial Virus by PCR: POSITIVE — AB
SARS Coronavirus 2 by RT PCR: NEGATIVE

## 2023-03-15 LAB — CBC WITH DIFFERENTIAL/PLATELET
Abs Immature Granulocytes: 0.02 10*3/uL (ref 0.00–0.07)
Basophils Absolute: 0 10*3/uL (ref 0.0–0.1)
Basophils Relative: 1 %
Eosinophils Absolute: 0.2 10*3/uL (ref 0.0–0.5)
Eosinophils Relative: 2 %
HCT: 38.7 % (ref 36.0–46.0)
Hemoglobin: 13.1 g/dL (ref 12.0–15.0)
Immature Granulocytes: 0 %
Lymphocytes Relative: 13 %
Lymphs Abs: 1.2 10*3/uL (ref 0.7–4.0)
MCH: 29.2 pg (ref 26.0–34.0)
MCHC: 33.9 g/dL (ref 30.0–36.0)
MCV: 86.2 fL (ref 80.0–100.0)
Monocytes Absolute: 0.6 10*3/uL (ref 0.1–1.0)
Monocytes Relative: 7 %
Neutro Abs: 6.7 10*3/uL (ref 1.7–7.7)
Neutrophils Relative %: 77 %
Platelets: 349 10*3/uL (ref 150–400)
RBC: 4.49 MIL/uL (ref 3.87–5.11)
RDW: 12.1 % (ref 11.5–15.5)
WBC: 8.7 10*3/uL (ref 4.0–10.5)
nRBC: 0 % (ref 0.0–0.2)

## 2023-03-15 LAB — COMPREHENSIVE METABOLIC PANEL
ALT: 19 U/L (ref 0–44)
AST: 18 U/L (ref 15–41)
Albumin: 4.2 g/dL (ref 3.5–5.0)
Alkaline Phosphatase: 87 U/L (ref 38–126)
Anion gap: 8 (ref 5–15)
BUN: 13 mg/dL (ref 6–20)
CO2: 25 mmol/L (ref 22–32)
Calcium: 9.2 mg/dL (ref 8.9–10.3)
Chloride: 102 mmol/L (ref 98–111)
Creatinine, Ser: 0.81 mg/dL (ref 0.44–1.00)
GFR, Estimated: >60 mL/min (ref >60)
Glucose, Bld: 96 mg/dL (ref 70–99)
Potassium: 3.8 mmol/L (ref 3.5–5.1)
Sodium: 135 mmol/L (ref 135–145)
Total Bilirubin: 0.5 mg/dL (ref <1.2)
Total Protein: 7.3 g/dL (ref 6.5–8.1)

## 2023-03-15 LAB — MAGNESIUM: Magnesium: 2.5 mg/dL — ABNORMAL HIGH (ref 1.7–2.4)

## 2023-03-15 LAB — HCG, SERUM, QUALITATIVE: Preg, Serum: NEGATIVE

## 2023-03-15 LAB — TROPONIN I (HIGH SENSITIVITY): Troponin I (High Sensitivity): 2 ng/L (ref <18)

## 2023-03-15 MED ORDER — ALBUTEROL SULFATE HFA 108 (90 BASE) MCG/ACT IN AERS
2.0000 | INHALATION_SPRAY | Freq: Once | RESPIRATORY_TRACT | Status: AC
  Administered 2023-03-15: 2 via RESPIRATORY_TRACT
  Filled 2023-03-15: qty 6.7

## 2023-03-15 MED ORDER — ALBUTEROL SULFATE (2.5 MG/3ML) 0.083% IN NEBU
2.5000 mg | INHALATION_SOLUTION | Freq: Once | RESPIRATORY_TRACT | Status: AC
  Administered 2023-03-15: 2.5 mg via RESPIRATORY_TRACT
  Filled 2023-03-15: qty 3

## 2023-03-15 MED ORDER — PREDNISONE 20 MG PO TABS: ORAL_TABLET | ORAL | 0 refills | Status: DC

## 2023-03-15 MED ORDER — IPRATROPIUM-ALBUTEROL 0.5-2.5 (3) MG/3ML IN SOLN
3.0000 mL | Freq: Once | RESPIRATORY_TRACT | Status: AC
  Administered 2023-03-15: 3 mL via RESPIRATORY_TRACT
  Filled 2023-03-15: qty 3

## 2023-03-15 MED ORDER — PREDNISONE 20 MG PO TABS
60.0000 mg | ORAL_TABLET | Freq: Once | ORAL | Status: AC
  Administered 2023-03-15: 60 mg via ORAL
  Filled 2023-03-15: qty 3

## 2023-03-15 NOTE — ED Provider Notes (Addendum)
[place] EMERGENCY DEPARTMENT AT [facility] Provider Note   CSN: [id number] Arrival date & time: 03/15/23  0800     History  Chief Complaint  Patient presents with   Cough   Shortness of Breath         Kristin Rice is a 37 y.o. female.  HPI 37 year old female presents for cough and shortness of breath.  She has been sick for about 1 week.  During the first few days she had some fevers.  She has had sore throat, cough, ear pain.  Went to urgent care 2 days ago and was put on azithromycin and amoxicillin as well as Tessalon and Promethazine DM.  She states that over the last 24 hours the cough seems to be more often and she is getting short of breath more, both with coughing and without.  For the past couple days she has had pain in both of her ribs/chest.  She was told she had pneumonia at urgent care.  She has been compliant with the antibiotics including taking them both this morning.  She does have a history of asthma that she states is exercise-induced.  Home Medications Prior to Admission medications   Medication Sig Start Date End Date Taking? Authorizing Provider  predniSONE (DELTASONE) 20 MG tablet 2 tabs po daily x 4 days 03/16/23  Yes Pricilla Loveless, MD  albuterol (VENTOLIN HFA) 108 (90 Base) MCG/ACT inhaler Inhale 2 puffs into the lungs every 6 (six) hours as needed for wheezing. 07/12/21   Wallis Bamberg, PA-C  APAP-Isometheptene-Dichloral (MIDRIN PO) Take by mouth. Patient not taking: Reported on 07/23/2022    [provider]  Azelastine-Fluticasone 137-50 MCG/ACT SUSP  05/24/19   [provider]  benzonatate (TESSALON) 100 MG capsule Take 1 capsule (100 mg total) by mouth every 8 (eight) hours. Patient not taking: Reported on 07/23/2022 11/15/21   Valinda Hoar, NP  dextromethorphan-guaiFENesin [facility] DM) 30-600 MG 12hr tablet Take 1 tablet by mouth every 12 (twelve) hours. Patient not taking: Reported on 11/22/2022    [provider]  fexofenadine (ALLEGRA) 180 MG tablet Take 180 mg by mouth daily. Patient not taking: Reported on 07/23/2022    [provider]  fluticasone (FLONASE) 50 MCG/ACT nasal spray Place 2 sprays into the nose daily.    [provider]  HYDROcodone-acetaminophen (NORCO) 5-325 MG per tablet Take 1 tablet by mouth every 6 (six) hours as needed for pain. Patient not taking: Reported on 07/23/2022 08/25/12   Sherren Mocha, MD  levocetirizine (XYZAL) 5 MG tablet Take 1 tablet (5 mg total) by mouth every evening. 07/12/21   Wallis Bamberg, PA-C  promethazine (PHENERGAN) 25 MG tablet Take 1 tablet (25 mg total) by mouth every 6 (six) hours as needed for nausea or vomiting. Patient not taking: Reported on 07/23/2022 05/26/22   Immordino, Jeannett Senior, FNP  promethazine-dextromethorphan (PROMETHAZINE-DM) 6.25-15 MG/5ML syrup Take 5 mLs by mouth 4 (four) times daily as needed for cough. Patient not taking: Reported on 07/23/2022 11/15/21   Valinda Hoar, NP  Rimegepant Sulfate (NURTEC) 75 MG TBDP Take by mouth.    [provider]  topiramate (TOPAMAX) 200 MG tablet Take 200 mg by mouth 2 (two) times daily. Patient not taking: Reported on 07/23/2022    [provider]      Allergies    Augmentin [amoxicillin-pot clavulanate], Avelox [moxifloxacin hcl in nacl], and Relpax [eletriptan]    Review of Systems   Review of Systems  Constitutional:  Negative for fever (had fever earlier in the course, none now).  HENT:  Positive for sore throat.   Respiratory:  Positive for cough and shortness of breath.   Cardiovascular:  Positive for chest pain.    Physical Exam Updated Vital Signs BP (!) 141/89   Pulse 100   Temp 98.3 F (36.8 C) (Oral)   Resp 18   Ht 5\' 3"  (1.6 m)   Wt 68 kg   LMP 03/05/2023   SpO2 98%   BMI 26.57 kg/m  Physical Exam Vitals and nursing note reviewed.  Constitutional:      General: She is not in acute distress.    Appearance: She is  well-developed. She is not ill-appearing or diaphoretic.  HENT:     Head: Normocephalic and atraumatic.  Cardiovascular:     Rate and Rhythm: Regular rhythm. Tachycardia present.     Heart sounds: Normal heart sounds.  Pulmonary:     Effort: Pulmonary effort is normal. No tachypnea or accessory muscle usage.     Breath sounds: Wheezing and rhonchi present.     Comments: Frequent coughing Otherwise speaks in complete sentences Abdominal:     Palpations: Abdomen is soft.     Tenderness: There is no abdominal tenderness.  Skin:    General: Skin is warm and dry.  Neurological:     Mental Status: She is alert.     ED Results / Procedures / Treatments   Labs (all labs ordered are listed, but only abnormal results are displayed) Labs Reviewed  RESP PANEL BY RT-PCR (RSV, FLU A&B, COVID)  RVPGX2 - Abnormal; Notable for the following components:      Result Value   Resp Syncytial Virus by PCR POSITIVE (*)    All other components within normal limits  MAGNESIUM - Abnormal; Notable for the following components:   Magnesium 2.5 (*)    All other components within normal limits  COMPREHENSIVE METABOLIC PANEL  CBC WITH DIFFERENTIAL/PLATELET  HCG, SERUM, QUALITATIVE  TROPONIN I (HIGH SENSITIVITY)    EKG EKG Interpretation Date/Time:  Friday March 15 2023 08:09:05 EST Ventricular Rate:  101 PR Interval:  100 QRS Duration:  159 QT Interval:  412 QTC Calculation: 535 R Axis:   -13  Text Interpretation: Sinus tachycardia IVCD, consider atypical LBBB Baseline wander in lead(s) II nonspecific changes No old tracing to compare Confirmed by Pricilla Loveless [id number]) on 03/15/2023 8:59:07 AM  Radiology DG Chest 2 View Result Date: 03/15/2023 CLINICAL DATA:  Shortness of breath with cough. Recently diagnosed with pneumonia. No improvement after antibiotics. EXAM: CHEST - 2 VIEW COMPARISON:  Radiographs 09/19/2022 and 09/15/2020. No recent comparison studies. FINDINGS: The heart size and  mediastinal contours are normal. The lungs are clear. There is no pleural effusion or pneumothorax. No acute osseous findings are identified. Telemetry leads overlie the chest. IMPRESSION: No evidence of active cardiopulmonary process. Electronically Signed   By: Carey Bullocks M.D.   On: 03/15/2023 08:48    Procedures Procedures    Medications Ordered in ED Medications  ipratropium-albuterol (DUONEB) 0.5-2.5 (3) MG/3ML nebulizer solution 3 mL (3 mLs Nebulization Given 03/15/23 0920)  albuterol (PROVENTIL) (2.5 MG/3ML) 0.083% nebulizer solution 2.5 mg (2.5 mg Nebulization Given 03/15/23 0920)  predniSONE (DELTASONE) tablet 60 mg (60 mg Oral Given 03/15/23 0920)  albuterol (VENTOLIN HFA) 108 (90 Base) MCG/ACT inhaler 2 puff (2 puffs Inhalation Given 03/15/23 1120)    ED Course/ Medical Decision Making/ A&P  Medical Decision Making Amount and/or Complexity of Data Reviewed External Data Reviewed: notes.    Details: Urgent care notes from 2 days ago Labs: ordered. Radiology: ordered and independent interpretation performed.    Details: No pneumonia ECG/medicine tests: ordered and independent interpretation performed.    Details: Sinus tachycardia with nonspecific changes  Risk Prescription drug management.   Patient feels a lot better after a breathing treatment.  I think with her history of asthma, it would be beneficial for her to be on steroids to help get her through this process.  I do not think the antibiotics need to be adjusted as I do not think she has a bacterial infection though she did have otitis media on the urgent care evaluation a few days ago.  I think she can continue the antibiotics but otherwise we will start steroids and give her an inhaler to take home.  She feels a lot better and her heart rate has improved.  Her blood pressure was pretty elevated when she first got here though this is improving as well.  I discussed she should  follow-up with a PCP to get this rechecked.  She otherwise feels better and is stable for discharge home with return precautions.  Low suspicion for occult pneumonia, PE, or other emergent process.  Of note, her RSV test came back after her workup and when she went home due to delay in collecting this.  However I do not think this will change management as the symptoms have been ongoing for several days.  Patient had been previously instructed to follow-up her results in MyChart.      Final Clinical Impression(s) / ED Diagnoses Final diagnoses:  Exacerbation of asthma, unspecified asthma severity, unspecified whether persistent    Rx / DC Orders ED Discharge Orders          Ordered    predniSONE (DELTASONE) 20 MG tablet        03/15/23 1104              Pricilla Loveless, MD 03/15/23 1456    Pricilla Loveless, MD 03/15/23 [phone]

## 2023-03-15 NOTE — Discharge Instructions (Addendum)
Continue the antibiotics and cough medicines prescribed by the urgent care.  You may also take Mucinex to help break up your congestion.  Use the albuterol inhaler 1-2 puffs every 4 hours for cough, shortness of breath, or wheezing.  Start the steroid prescription tomorrow, 12/21.  Your blood pressure was elevated today.  While this is improving, you should follow-up with a primary care physician for reevaluation of your blood pressure and possible treatment.  He also should have a primary care physician for general medical care.  If you develop new or worsening shortness of breath, coughing up blood, fever, chest pain, or any other new/concerning symptoms then return to the ER or call 911.

## 2023-03-15 NOTE — ED Triage Notes (Signed)
Pt was seen at an urgent care and dx with pneumonia. Pt was discharged with abx treatment, but says since then she has been getting worse not better. Pt endorses cough, SOB with coughing, and some nausea.

## 2023-03-27 HISTORY — PX: KNEE SURGERY: SHX244

## 2023-09-16 ENCOUNTER — Ambulatory Visit
Admission: EM | Admit: 2023-09-16 | Discharge: 2023-09-16 | Disposition: A | Discharge status: Home / Self Care | Attending: Emergency Medicine | Admitting: Emergency Medicine

## 2023-09-16 DIAGNOSIS — J4521 Mild intermittent asthma with (acute) exacerbation: Secondary | ICD-10-CM

## 2023-09-16 DIAGNOSIS — J0100 Acute maxillary sinusitis, unspecified: Secondary | ICD-10-CM

## 2023-09-16 DIAGNOSIS — H6692 Otitis media, unspecified, left ear: Secondary | ICD-10-CM

## 2023-09-16 MED ORDER — PREDNISONE 10 MG PO TABS: 40.0000 mg | ORAL_TABLET | Freq: Every day | ORAL | 0 refills | Status: AC

## 2023-09-16 MED ORDER — AMOXICILLIN 875 MG PO TABS: 875.0000 mg | ORAL_TABLET | Freq: Two times a day (BID) | ORAL | 0 refills | Status: AC

## 2023-09-16 NOTE — ED Provider Notes (Signed)
 Kristin Rice    CSN: [id number] Arrival date & time: 09/16/23  1834      History   Chief Complaint Chief Complaint  Patient presents with   Cough    HPI Kristin Rice is a 38 y.o. female.  Patient presents with sinus pressure, congestion, cough x 10 days.  She reports tightness when trying to take a deep breath.  She used her albuterol  inhaler this morning.  She is also taking Mucinex and allergy medication.  She denies fever, chest pain, shortness of breath.  Her medical history includes allergies and asthma.  The history is provided by the patient and medical records.    Past Medical History:  Diagnosis Date   Allergy    Arthritis    Asthma    COVID-19 07/2020   Migraines     There are no active problems to display for this patient.   Past Surgical History:  Procedure Laterality Date   HAND SURGERY     KNEE SURGERY     MIDDLE EAR SURGERY      OB History   No obstetric history on file.      Home Medications    Prior to Admission medications   Medication Sig Start Date End Date Taking? Authorizing Provider  amoxicillin  (AMOXIL ) 875 MG tablet Take 1 tablet (875 mg total) by mouth 2 (two) times daily for 10 days. 09/16/23 09/26/23 Yes Corlis Burnard DEL, NP  predniSONE  (DELTASONE ) 10 MG tablet Take 4 tablets (40 mg total) by mouth daily for 5 days. 09/16/23 09/21/23 Yes Corlis Burnard DEL, NP  albuterol  (VENTOLIN  HFA) 108 (90 Base) MCG/ACT inhaler Inhale 2 puffs into the lungs every 6 (six) hours as needed for wheezing. 07/12/21   Christopher Savannah, PA-C  APAP-Isometheptene-Dichloral (MIDRIN PO) Take by mouth. Patient not taking: Reported on 07/23/2022    [provider]  Azelastine-Fluticasone 137-50 MCG/ACT SUSP  05/24/19   [provider]  benzonatate  (TESSALON ) 100 MG capsule Take 1 capsule (100 mg total) by mouth every 8 (eight) hours. Patient not taking: Reported on 07/23/2022 11/15/21   Teresa Shelba SAUNDERS, NP  dextromethorphan-guaiFENesin  (MUCINEX DM) 30-600 MG 12hr tablet Take 1 tablet by mouth every 12 (twelve) hours. Patient not taking: Reported on 11/22/2022    [provider]  fexofenadine (ALLEGRA) 180 MG tablet Take 180 mg by mouth daily. Patient not taking: Reported on 07/23/2022    [provider]  fluticasone (FLONASE) 50 MCG/ACT nasal spray Place 2 sprays into the nose daily.    [provider]  HYDROcodone -acetaminophen  (NORCO) 5-325 MG per tablet Take 1 tablet by mouth every 6 (six) hours as needed for pain. Patient not taking: Reported on 07/23/2022 08/25/12   Loreli Elyn SAILOR, MD  levocetirizine (XYZAL ) 5 MG tablet Take 1 tablet (5 mg total) by mouth every evening. 07/12/21   Christopher Savannah, PA-C  promethazine  (PHENERGAN ) 25 MG tablet Take 1 tablet (25 mg total) by mouth every 6 (six) hours as needed for nausea or vomiting. Patient not taking: Reported on 07/23/2022 05/26/22   Immordino, Garnette, FNP  promethazine -dextromethorphan (PROMETHAZINE -DM) 6.25-15 MG/5ML syrup Take 5 mLs by mouth 4 (four) times daily as needed for cough. Patient not taking: Reported on 07/23/2022 11/15/21   Teresa Shelba SAUNDERS, NP  Rimegepant Sulfate (NURTEC) 75 MG TBDP Take by mouth.    [provider]  topiramate (TOPAMAX) 200 MG tablet Take 200 mg by mouth 2 (two) times daily. Patient not taking: Reported on 07/23/2022  [provider]    Family History Family History  Problem Relation Age of Onset   Diabetes Mother    Heart disease Father    Allergies Father    Allergies Brother     Social History Social History   Tobacco Use   Smoking status: Never   Smokeless tobacco: Never  Vaping Use   Vaping status: Never Used  Substance Use Topics   Alcohol use: Yes    Alcohol/week: 1.0 standard drink of alcohol    Types: 1 drink(s) per week   Drug use: No     Allergies   Avelox [moxifloxacin hcl in nacl], Potassium clavulanate [clavulanic acid], and Relpax [eletriptan]   Review of Systems Review  of Systems  Constitutional:  Negative for chills and fever.  HENT:  Positive for congestion. Negative for ear pain and sore throat.   Respiratory:  Positive for cough and chest tightness. Negative for shortness of breath.   Cardiovascular:  Negative for chest pain and palpitations.     Physical Exam Triage Vital Signs ED Triage Vitals [09/16/23 1850]  Encounter Vitals Group     BP 130/88     Girls Systolic BP Percentile      Girls Diastolic BP Percentile      Boys Systolic BP Percentile      Boys Diastolic BP Percentile      Pulse Rate 98     Resp 18     Temp 98 F (36.7 C)     Temp src      SpO2 98 %     Weight      Height      Head Circumference      Peak Flow      Pain Score      Pain Loc      Pain Education      Exclude from Growth Chart    No data found.  Updated Vital Signs BP 130/88   Pulse 98   Temp 98 F (36.7 C)   Resp 18   LMP 08/29/2023   SpO2 98%   Visual Acuity Right Eye Distance:   Left Eye Distance:   Bilateral Distance:    Right Eye Near:   Left Eye Near:    Bilateral Near:     Physical Exam Constitutional:      General: She is not in acute distress. HENT:     Left Ear: Tympanic membrane is erythematous.     Nose: Congestion present.     Mouth/Throat:     Mouth: Mucous membranes are moist.     Pharynx: Oropharynx is clear.   Cardiovascular:     Rate and Rhythm: Normal rate and regular rhythm.     Heart sounds: Normal heart sounds.  Pulmonary:     Effort: Pulmonary effort is normal. No respiratory distress.     Breath sounds: Normal breath sounds. No wheezing.   Neurological:     Mental Status: She is alert.      UC Treatments / Results  Labs (all labs ordered are listed, but only abnormal results are displayed) Labs Reviewed - No data to display  EKG   Radiology No results found.  Procedures Procedures (including critical care time)  Medications Ordered in UC Medications - No data to display  Initial  Impression / Assessment and Plan / UC Course  I have reviewed the triage vital signs and the nursing notes.  Pertinent labs & imaging results that were available during my care  of the patient were reviewed by me and considered in my medical decision making (see chart for details).    Left otitis media, acute sinusitis, asthma exacerbation.  Afebrile and vital signs are stable.  Lungs are clear at this time and O2 sat is 98% on room air.  Patient has been using her albuterol  inhaler but feels tightness with breathing.  Treating today with 10-day course of amoxicillin  for otitis media and sinusitis.  Instructed patient to continue her albuterol  inhaler and treating asthma exacerbation with prednisone .  Instructed her to follow-up with her PCP tomorrow.  ED precautions given.  Education provided on otitis media, sinus infection, asthma.  She agrees to plan of care.  Final Clinical Impressions(s) / UC Diagnoses   Final diagnoses:  Left otitis media, unspecified otitis media type  Acute non-recurrent maxillary sinusitis  Mild intermittent asthma with acute exacerbation     Discharge Instructions      Take the amoxicillin  and prednisone  as directed.  Use your albuterol  inhaler as directed.  Follow up with your primary care provider tomorrow.  Go to the emergency department if you have worsening symptoms.        ED Prescriptions     Medication Sig Dispense Auth. Provider   amoxicillin  (AMOXIL ) 875 MG tablet Take 1 tablet (875 mg total) by mouth 2 (two) times daily for 10 days. 20 tablet Corlis Burnard DEL, NP   predniSONE  (DELTASONE ) 10 MG tablet Take 4 tablets (40 mg total) by mouth daily for 5 days. 20 tablet Corlis Burnard DEL, NP      PDMP not reviewed this encounter.   Corlis Burnard DEL, NP 09/16/23 [id number]

## 2023-09-16 NOTE — ED Triage Notes (Signed)
 Patient to Urgent Care with complaints of facial/ sinus pressure/ gum pain and productive cough. Denies any fevers.   Symptoms started 10 days ago.  Meds: allergy meds/ mucinex

## 2023-09-16 NOTE — Discharge Instructions (Addendum)
 Take the amoxicillin  and prednisone  as directed.  Use your albuterol  inhaler as directed.  Follow up with your primary care provider tomorrow.  Go to the emergency department if you have worsening symptoms.

## 2024-01-10 ENCOUNTER — Ambulatory Visit: Admission: EM | Admit: 2024-01-10 | Discharge: 2024-01-10 | Disposition: A | Discharge status: Home / Self Care

## 2024-01-10 DIAGNOSIS — J0100 Acute maxillary sinusitis, unspecified: Secondary | ICD-10-CM | POA: Diagnosis not present

## 2024-01-10 MED ORDER — AMOXICILLIN 875 MG PO TABS: 875.0000 mg | ORAL_TABLET | Freq: Two times a day (BID) | ORAL | 0 refills | Status: AC

## 2024-01-10 NOTE — ED Triage Notes (Signed)
 Patient to Urgent Care with complaints of nasal congestion/ drainage/ sinus pain and pressure. No fevers.   Symptoms x5 days. Feels like symptoms are worsening.   Meds: Mucinex/ dayquil/ sudafed/ allergy meds.

## 2024-01-10 NOTE — ED Provider Notes (Signed)
 Kristin Rice    CSN: [id number] Arrival date & time: 01/10/24  1338      History   Chief Complaint Chief Complaint  Patient presents with   Nasal Congestion    HPI Kristin Rice is a 38 y.o. female.  Patient presents with 1 week history of congestion, sinus pressure, postnasal drip, sinus pain, mild occasional cough which she attributes to drainage.  No fever, wheezing, or shortness of breath.  Several OTC sinus and allergy medications attempted without relief.  The history is provided by the patient and medical records.    Past Medical History:  Diagnosis Date   Allergy    Arthritis    Asthma    COVID-19 07/2020   Migraines     There are no active problems to display for this patient.   Past Surgical History:  Procedure Laterality Date   HAND SURGERY     KNEE SURGERY     KNEE SURGERY Right 2025   MIDDLE EAR SURGERY      OB History   No obstetric history on file.      Home Medications    Prior to Admission medications   Medication Sig Start Date End Date Taking? Authorizing Provider  amoxicillin  (AMOXIL ) 875 MG tablet Take 1 tablet (875 mg total) by mouth 2 (two) times daily for 10 days. 01/10/24 01/20/24 Yes Corlis Burnard DEL, NP  albuterol  (VENTOLIN  HFA) 108 (90 Base) MCG/ACT inhaler Inhale 2 puffs into the lungs every 6 (six) hours as needed for wheezing. 07/12/21   Christopher Savannah, PA-C  APAP-Isometheptene-Dichloral (MIDRIN PO) Take by mouth. Patient not taking: Reported on 07/23/2022    [provider]  Azelastine-Fluticasone 137-50 MCG/ACT SUSP  05/24/19   [provider]  benzonatate  (TESSALON ) 100 MG capsule Take 1 capsule (100 mg total) by mouth every 8 (eight) hours. Patient not taking: Reported on 07/23/2022 11/15/21   Teresa Shelba SAUNDERS, NP  celecoxib (CELEBREX) 200 MG capsule Take 200 mg by mouth 2 (two) times daily.    [provider]  dextromethorphan-guaiFENesin (MUCINEX DM) 30-600 MG 12hr tablet Take 1 tablet  by mouth every 12 (twelve) hours. Patient not taking: Reported on 11/22/2022    [provider]  fexofenadine (ALLEGRA) 180 MG tablet Take 180 mg by mouth daily. Patient not taking: Reported on 07/23/2022    [provider]  fluticasone (FLONASE) 50 MCG/ACT nasal spray Place 2 sprays into the nose daily.    [provider]  HYDROcodone -acetaminophen  (NORCO) 5-325 MG per tablet Take 1 tablet by mouth every 6 (six) hours as needed for pain. Patient not taking: Reported on 07/23/2022 08/25/12   Loreli Elyn SAILOR, MD  levocetirizine (XYZAL ) 5 MG tablet Take 1 tablet (5 mg total) by mouth every evening. 07/12/21   Christopher Savannah, PA-C  promethazine  (PHENERGAN ) 25 MG tablet Take 1 tablet (25 mg total) by mouth every 6 (six) hours as needed for nausea or vomiting. Patient not taking: Reported on 07/23/2022 05/26/22   Immordino, Garnette, FNP  promethazine -dextromethorphan (PROMETHAZINE -DM) 6.25-15 MG/5ML syrup Take 5 mLs by mouth 4 (four) times daily as needed for cough. Patient not taking: Reported on 07/23/2022 11/15/21   Teresa Shelba SAUNDERS, NP  Rimegepant Sulfate (NURTEC) 75 MG TBDP Take by mouth.    [provider]  topiramate (TOPAMAX) 200 MG tablet Take 200 mg by mouth 2 (two) times daily. Patient not taking: Reported on 07/23/2022    [provider]    Family History Family History  Problem  Relation Age of Onset   Diabetes Mother    Heart disease Father    Allergies Father    Allergies Brother     Social History Social History   Tobacco Use   Smoking status: Never   Smokeless tobacco: Never  Vaping Use   Vaping status: Never Used  Substance Use Topics   Alcohol use: Yes    Alcohol/week: 1.0 standard drink of alcohol    Types: 1 drink(s) per week   Drug use: No     Allergies   Avelox [moxifloxacin hcl in nacl], Potassium clavulanate [clavulanic acid], and Relpax [eletriptan]   Review of Systems Review of Systems  Constitutional:  Negative for  chills and fever.  HENT:  Positive for congestion, postnasal drip, rhinorrhea, sinus pressure and sinus pain. Negative for ear pain and sore throat.   Respiratory:  Positive for cough. Negative for shortness of breath.      Physical Exam Triage Vital Signs ED Triage Vitals  Encounter Vitals Group     BP 01/10/24 1416 130/80     Girls Systolic BP Percentile --      Girls Diastolic BP Percentile --      Boys Systolic BP Percentile --      Boys Diastolic BP Percentile --      Pulse Rate 01/10/24 1416 64     Resp 01/10/24 1416 19     Temp 01/10/24 1416 98 F (36.7 C)     Temp src --      SpO2 01/10/24 1416 98 %     Weight --      Height --      Head Circumference --      Peak Flow --      Pain Score 01/10/24 1412 4     Pain Loc --      Pain Education --      Exclude from Growth Chart --    No data found.  Updated Vital Signs BP 130/80   Pulse 64   Temp 98 F (36.7 C)   Resp 19   LMP 01/03/2024   SpO2 98%   Visual Acuity Right Eye Distance:   Left Eye Distance:   Bilateral Distance:    Right Eye Near:   Left Eye Near:    Bilateral Near:     Physical Exam Constitutional:      General: She is not in acute distress. HENT:     Right Ear: Tympanic membrane normal.     Left Ear: Tympanic membrane is erythematous.     Nose: Congestion and rhinorrhea present.     Mouth/Throat:     Mouth: Mucous membranes are moist.     Pharynx: Oropharynx is clear.  Cardiovascular:     Rate and Rhythm: Normal rate and regular rhythm.     Heart sounds: Normal heart sounds.  Pulmonary:     Effort: Pulmonary effort is normal. No respiratory distress.     Breath sounds: Normal breath sounds. No wheezing.  Neurological:     Mental Status: She is alert.      UC Treatments / Results  Labs (all labs ordered are listed, but only abnormal results are displayed) Labs Reviewed - No data to display  EKG   Radiology No results found.  Procedures Procedures (including critical  care time)  Medications Ordered in UC Medications - No data to display  Initial Impression / Assessment and Plan / UC Course  I have reviewed the triage vital signs and  the nursing notes.  Pertinent labs & imaging results that were available during my care of the patient were reviewed by me and considered in my medical decision making (see chart for details).    Acute sinusitis.  Afebrile and vital signs are stable.  Lungs are clear and O2 sat is 98% on room air.  Treating today with 10-day course of amoxicillin .  Tylenol  or ibuprofen as needed.  Plain Mucinex as needed.  Education provided on sinus infection. Instructed patient to follow up with her PCP if her symptoms are not improving.  She agrees to plan of care.   Final Clinical Impressions(s) / UC Diagnoses   Final diagnoses:  Acute non-recurrent maxillary sinusitis     Discharge Instructions      Take the amoxicillin  as directed.  Follow-up with your primary care provider if your symptoms are not improving.      ED Prescriptions     Medication Sig Dispense Auth. Provider   amoxicillin  (AMOXIL ) 875 MG tablet Take 1 tablet (875 mg total) by mouth 2 (two) times daily for 10 days. 20 tablet Corlis Burnard DEL, NP      PDMP not reviewed this encounter.   Corlis Burnard DEL, NP 01/10/24 [phone]

## 2024-01-10 NOTE — Discharge Instructions (Addendum)
 Take the amoxicillin as directed.  Follow up with your primary care provider if your symptoms are not improving.
